# Patient Record
Sex: Male | Born: 1965 | Race: Black or African American | Hispanic: No | Marital: Single | Smoking: Never smoker
Health system: Southern US, Community
[De-identification: ages and names within clinical notes are randomized; demographics above are authoritative.]

## PROBLEM LIST (undated history)

## (undated) DIAGNOSIS — Z9989 Dependence on other enabling machines and devices: Secondary | ICD-10-CM

## (undated) DIAGNOSIS — F419 Anxiety disorder, unspecified: Secondary | ICD-10-CM

## (undated) DIAGNOSIS — R413 Other amnesia: Secondary | ICD-10-CM

## (undated) DIAGNOSIS — F1020 Alcohol dependence, uncomplicated: Secondary | ICD-10-CM

## (undated) DIAGNOSIS — F32A Depression, unspecified: Secondary | ICD-10-CM

## (undated) DIAGNOSIS — F329 Major depressive disorder, single episode, unspecified: Secondary | ICD-10-CM

## (undated) DIAGNOSIS — I10 Essential (primary) hypertension: Secondary | ICD-10-CM

## (undated) DIAGNOSIS — K7010 Alcoholic hepatitis without ascites: Secondary | ICD-10-CM

## (undated) DIAGNOSIS — R002 Palpitations: Secondary | ICD-10-CM

## (undated) DIAGNOSIS — G709 Myoneural disorder, unspecified: Secondary | ICD-10-CM

## (undated) DIAGNOSIS — F319 Bipolar disorder, unspecified: Secondary | ICD-10-CM

## (undated) HISTORY — DX: Palpitations: R00.2

## (undated) HISTORY — DX: Alcohol dependence, uncomplicated: F10.20

## (undated) HISTORY — PX: FINGER SURGERY: SHX640

---
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
## 2015-03-23 ENCOUNTER — Emergency Department (HOSPITAL_COMMUNITY)
Admission: EM | Admit: 2015-03-23 | Discharge: 2015-03-23 | Disposition: A | Discharge status: Home / Self Care | Payer: Self-pay | Attending: Emergency Medicine | Admitting: Emergency Medicine

## 2015-03-23 ENCOUNTER — Emergency Department (HOSPITAL_COMMUNITY): Payer: Self-pay

## 2015-03-23 ENCOUNTER — Encounter (HOSPITAL_COMMUNITY): Payer: Self-pay | Admitting: *Deleted

## 2015-03-23 DIAGNOSIS — S0181XA Laceration without foreign body of other part of head, initial encounter: Secondary | ICD-10-CM | POA: Insufficient documentation

## 2015-03-23 DIAGNOSIS — S8992XA Unspecified injury of left lower leg, initial encounter: Secondary | ICD-10-CM | POA: Insufficient documentation

## 2015-03-23 DIAGNOSIS — IMO0002 Reserved for concepts with insufficient information to code with codable children: Secondary | ICD-10-CM

## 2015-03-23 DIAGNOSIS — Y998 Other external cause status: Secondary | ICD-10-CM | POA: Insufficient documentation

## 2015-03-23 DIAGNOSIS — S4992XA Unspecified injury of left shoulder and upper arm, initial encounter: Secondary | ICD-10-CM | POA: Insufficient documentation

## 2015-03-23 DIAGNOSIS — S6991XA Unspecified injury of right wrist, hand and finger(s), initial encounter: Secondary | ICD-10-CM | POA: Insufficient documentation

## 2015-03-23 DIAGNOSIS — Y92410 Unspecified street and highway as the place of occurrence of the external cause: Secondary | ICD-10-CM | POA: Insufficient documentation

## 2015-03-23 DIAGNOSIS — Y9389 Activity, other specified: Secondary | ICD-10-CM | POA: Insufficient documentation

## 2015-03-23 LAB — BASIC METABOLIC PANEL
ANION GAP: 15 (ref 5–15)
BUN: 9 mg/dL (ref 6–20)
CALCIUM: 9.1 mg/dL (ref 8.9–10.3)
CO2: 18 mmol/L — AB (ref 22–32)
Chloride: 106 mmol/L (ref 101–111)
Creatinine, Ser: 0.78 mg/dL (ref 0.61–1.24)
GFR calc Af Amer: >60 mL/min (ref >60)
GFR calc non Af Amer: >60 mL/min (ref >60)
Glucose, Bld: 99 mg/dL (ref 65–99)
Potassium: 4.2 mmol/L (ref 3.5–5.1)
Sodium: 139 mmol/L (ref 135–145)

## 2015-03-23 LAB — CBC
HEMATOCRIT: 39.5 % (ref 39.0–52.0)
Hemoglobin: 13.6 g/dL (ref 13.0–17.0)
MCH: 27.8 pg (ref 26.0–34.0)
MCHC: 34.4 g/dL (ref 30.0–36.0)
MCV: 80.8 fL (ref 78.0–100.0)
Platelets: 195 10*3/uL (ref 150–400)
RBC: 4.89 MIL/uL (ref 4.22–5.81)
RDW: 13.7 % (ref 11.5–15.5)
WBC: 7.8 10*3/uL (ref 4.0–10.5)

## 2015-03-23 MED ORDER — SODIUM CHLORIDE 0.9 % IV BOLUS (SEPSIS)
1000.0000 mL | Freq: Once | INTRAVENOUS | Status: AC
  Administered 2015-03-23: 1000 mL via INTRAVENOUS

## 2015-03-23 MED ORDER — LIDOCAINE-EPINEPHRINE 1 %-1:100000 IJ SOLN
20.0000 mL | Freq: Once | INTRAMUSCULAR | Status: DC
  Filled 2015-03-23: qty 1

## 2015-03-23 MED ORDER — LORAZEPAM 2 MG/ML IJ SOLN
1.0000 mg | Freq: Once | INTRAMUSCULAR | Status: AC
  Administered 2015-03-23: 1 mg via INTRAVENOUS
  Filled 2015-03-23: qty 1

## 2015-03-23 MED ORDER — TETANUS-DIPHTHERIA TOXOIDS TD 5-2 LFU IM INJ
0.5000 mL | INJECTION | Freq: Once | INTRAMUSCULAR | Status: AC
  Administered 2015-03-23: 0.5 mL via INTRAMUSCULAR
  Filled 2015-03-23: qty 0.5

## 2015-03-23 MED ORDER — AMOXICILLIN 250 MG PO CAPS: 500.0000 mg | ORAL_CAPSULE | Freq: Two times a day (BID) | ORAL | Status: AC

## 2015-03-23 NOTE — ED Notes (Addendum)
Pt returned to room.  Xray tech informed me that the patient refused to have hand xrayed.  States he said he did not need it.

## 2015-03-23 NOTE — ED Notes (Signed)
Suture cart bedside. 
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
## 2015-03-23 NOTE — ED Notes (Signed)
Patient is alert.  Complains of severe headache and increasing swelling in his right hand.  Patient also complains of knee pain.  Patient has oral injuries as well.
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
## 2015-04-26 ENCOUNTER — Emergency Department (HOSPITAL_COMMUNITY): Payer: Self-pay

## 2015-04-26 ENCOUNTER — Encounter (HOSPITAL_COMMUNITY): Payer: Self-pay | Admitting: *Deleted

## 2015-04-26 ENCOUNTER — Emergency Department (HOSPITAL_COMMUNITY)
Admission: EM | Admit: 2015-04-26 | Discharge: 2015-04-26 | Disposition: A | Discharge status: Home / Self Care | Payer: Self-pay | Attending: Emergency Medicine | Admitting: Emergency Medicine

## 2015-04-26 DIAGNOSIS — R002 Palpitations: Secondary | ICD-10-CM | POA: Insufficient documentation

## 2015-04-26 DIAGNOSIS — R55 Syncope and collapse: Secondary | ICD-10-CM | POA: Insufficient documentation

## 2015-04-26 DIAGNOSIS — Z880 Allergy status to penicillin: Secondary | ICD-10-CM | POA: Insufficient documentation

## 2015-04-26 LAB — CBC
HCT: 40.5 % (ref 39.0–52.0)
Hemoglobin: 14 g/dL (ref 13.0–17.0)
MCH: 28.3 pg (ref 26.0–34.0)
MCHC: 34.6 g/dL (ref 30.0–36.0)
MCV: 82 fL (ref 78.0–100.0)
PLATELETS: 237 10*3/uL (ref 150–400)
RBC: 4.94 MIL/uL (ref 4.22–5.81)
RDW: 13.3 % (ref 11.5–15.5)
WBC: 4.4 10*3/uL (ref 4.0–10.5)

## 2015-04-26 LAB — BASIC METABOLIC PANEL
Anion gap: 12 (ref 5–15)
BUN: 7 mg/dL (ref 6–20)
CALCIUM: 8.8 mg/dL — AB (ref 8.9–10.3)
CO2: 22 mmol/L (ref 22–32)
CREATININE: 0.79 mg/dL (ref 0.61–1.24)
Chloride: 105 mmol/L (ref 101–111)
GFR calc Af Amer: >60 mL/min (ref >60)
GFR calc non Af Amer: >60 mL/min (ref >60)
GLUCOSE: 111 mg/dL — AB (ref 65–99)
Potassium: 3.7 mmol/L (ref 3.5–5.1)
Sodium: 139 mmol/L (ref 135–145)

## 2015-04-26 LAB — I-STAT TROPONIN, ED: Troponin i, poc: 0 ng/mL (ref 0.00–0.08)

## 2015-04-26 LAB — URINE MICROSCOPIC-ADD ON

## 2015-04-26 LAB — URINALYSIS, ROUTINE W REFLEX MICROSCOPIC
Bilirubin Urine: NEGATIVE
GLUCOSE, UA: NEGATIVE mg/dL
KETONES UR: NEGATIVE mg/dL
Leukocytes, UA: NEGATIVE
Nitrite: NEGATIVE
Protein, ur: 100 mg/dL — AB
Specific Gravity, Urine: 1.01 (ref 1.005–1.030)
Urobilinogen, UA: 1 mg/dL (ref 0.0–1.0)
pH: 6 (ref 5.0–8.0)

## 2015-04-26 LAB — D-DIMER, QUANTITATIVE (NOT AT ARMC): D DIMER QUANT: 0.62 ug{FEU}/mL — AB (ref 0.00–0.48)

## 2015-04-26 MED ORDER — IOHEXOL 350 MG/ML SOLN
100.0000 mL | Freq: Once | INTRAVENOUS | Status: AC | PRN
  Administered 2015-04-26: 100 mL via INTRAVENOUS

## 2015-04-26 MED ORDER — SODIUM CHLORIDE 0.9 % IV BOLUS (SEPSIS)
1000.0000 mL | Freq: Once | INTRAVENOUS | Status: AC
  Administered 2015-04-26: 1000 mL via INTRAVENOUS

## 2015-04-26 NOTE — ED Notes (Signed)
Pt to CT

## 2015-04-26 NOTE — Discharge Instructions (Signed)
Return to the ED with any concerns including fainting, difficulty breathing, chest pain, decreased level of alertness/lethargy, or any other alarming symptoms  The CT scan of your chest showed:   Possible thyroid enlargement. Suggest thyroid ultrasound  You should arrange for an appointment with a primary care doctor- call the number listed and discuss the thyroid findings with them.  You will need an ultrasound scheduled as an outpatient.    You should also avoid energy drinks and caffeinated beverages as these may be making your symptoms worse.  Also drink more water
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
## 2015-04-26 NOTE — ED Notes (Signed)
Pt c/o chest pain and blurred vision x 4 days worsening over the past 2 days. States that yesterday he almost passed out. States that he has also noticed blood in his underwear from his penis in the morning when he wakes up. Pt denies painful urination.
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
## 2015-04-26 NOTE — ED Notes (Signed)
Pt drinking water without any issue

## 2015-05-02 ENCOUNTER — Encounter: Payer: Self-pay | Admitting: *Deleted

## 2015-05-03 ENCOUNTER — Encounter: Payer: Self-pay | Admitting: Cardiology

## 2018-07-22 ENCOUNTER — Encounter: Payer: Self-pay | Admitting: Pediatric Intensive Care

## 2018-08-05 ENCOUNTER — Emergency Department (HOSPITAL_COMMUNITY): Payer: Self-pay

## 2018-08-05 ENCOUNTER — Emergency Department (HOSPITAL_COMMUNITY)
Admission: EM | Admit: 2018-08-05 | Discharge: 2018-08-05 | Disposition: A | Discharge status: Home / Self Care | Payer: Self-pay | Attending: Emergency Medicine | Admitting: Emergency Medicine

## 2018-08-05 DIAGNOSIS — R29898 Other symptoms and signs involving the musculoskeletal system: Secondary | ICD-10-CM

## 2018-08-05 DIAGNOSIS — Z79899 Other long term (current) drug therapy: Secondary | ICD-10-CM | POA: Insufficient documentation

## 2018-08-05 DIAGNOSIS — R531 Weakness: Secondary | ICD-10-CM | POA: Insufficient documentation

## 2018-08-05 LAB — BASIC METABOLIC PANEL
Anion gap: 9 (ref 5–15)
BUN: 8 mg/dL (ref 6–20)
CO2: 22 mmol/L (ref 22–32)
CREATININE: 0.74 mg/dL (ref 0.61–1.24)
Calcium: 9.1 mg/dL (ref 8.9–10.3)
Chloride: 109 mmol/L (ref 98–111)
GFR calc Af Amer: >60 mL/min (ref >60)
Glucose, Bld: 103 mg/dL — ABNORMAL HIGH (ref 70–99)
Potassium: 4.2 mmol/L (ref 3.5–5.1)
SODIUM: 140 mmol/L (ref 135–145)

## 2018-08-05 LAB — CBC WITH DIFFERENTIAL/PLATELET
ABS IMMATURE GRANULOCYTES: 0.02 10*3/uL (ref 0.00–0.07)
Basophils Absolute: 0.1 10*3/uL (ref 0.0–0.1)
Basophils Relative: 1 %
Eosinophils Absolute: 0.1 10*3/uL (ref 0.0–0.5)
Eosinophils Relative: 1 %
HCT: 43.5 % (ref 39.0–52.0)
HEMOGLOBIN: 13.6 g/dL (ref 13.0–17.0)
Immature Granulocytes: 0 %
LYMPHS ABS: 2.5 10*3/uL (ref 0.7–4.0)
Lymphocytes Relative: 51 %
MCH: 26.5 pg (ref 26.0–34.0)
MCHC: 31.3 g/dL (ref 30.0–36.0)
MCV: 84.6 fL (ref 80.0–100.0)
MONO ABS: 0.3 10*3/uL (ref 0.1–1.0)
MONOS PCT: 6 %
NEUTROS ABS: 2.1 10*3/uL (ref 1.7–7.7)
Neutrophils Relative %: 41 %
PLATELETS: 270 10*3/uL (ref 150–400)
RBC: 5.14 MIL/uL (ref 4.22–5.81)
RDW: 12.3 % (ref 11.5–15.5)
WBC: 5 10*3/uL (ref 4.0–10.5)
nRBC: 0 % (ref 0.0–0.2)

## 2018-08-05 LAB — ETHANOL: Alcohol, Ethyl (B): 237 mg/dL — ABNORMAL HIGH (ref <10)

## 2018-08-05 LAB — I-STAT CHEM 8, ED
BUN: 9 mg/dL (ref 6–20)
CALCIUM ION: 1.09 mmol/L — AB (ref 1.15–1.40)
Chloride: 109 mmol/L (ref 98–111)
Creatinine, Ser: 1.1 mg/dL (ref 0.61–1.24)
Glucose, Bld: 91 mg/dL (ref 70–99)
HCT: 43 % (ref 39.0–52.0)
Hemoglobin: 14.6 g/dL (ref 13.0–17.0)
Potassium: 4.2 mmol/L (ref 3.5–5.1)
SODIUM: 143 mmol/L (ref 135–145)
TCO2: 26 mmol/L (ref 22–32)

## 2018-08-05 NOTE — Discharge Instructions (Signed)
Follow up with your family doc and neurology.  Return to the ED for worsening symptoms

## 2018-08-05 NOTE — ED Notes (Signed)
Patient verbalizes understanding of discharge instructions. Opportunity for questioning and answers were provided. Armband removed by staff, pt discharged from ED ambulatory to home.  
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
## 2018-08-11 ENCOUNTER — Encounter (HOSPITAL_COMMUNITY): Payer: Self-pay | Admitting: Emergency Medicine

## 2018-08-11 ENCOUNTER — Emergency Department (HOSPITAL_COMMUNITY): Payer: Self-pay

## 2018-08-11 ENCOUNTER — Other Ambulatory Visit: Payer: Self-pay

## 2018-08-11 ENCOUNTER — Emergency Department (HOSPITAL_COMMUNITY)
Admission: EM | Admit: 2018-08-11 | Discharge: 2018-08-12 | Disposition: A | Discharge status: Home / Self Care | Payer: Self-pay | Attending: Emergency Medicine | Admitting: Emergency Medicine

## 2018-08-11 DIAGNOSIS — F10120 Alcohol abuse with intoxication, uncomplicated: Secondary | ICD-10-CM | POA: Insufficient documentation

## 2018-08-11 DIAGNOSIS — R531 Weakness: Secondary | ICD-10-CM | POA: Insufficient documentation

## 2018-08-11 DIAGNOSIS — R4182 Altered mental status, unspecified: Secondary | ICD-10-CM | POA: Insufficient documentation

## 2018-08-11 DIAGNOSIS — Y908 Blood alcohol level of 240 mg/100 ml or more: Secondary | ICD-10-CM | POA: Insufficient documentation

## 2018-08-11 LAB — URINALYSIS, ROUTINE W REFLEX MICROSCOPIC
Bilirubin Urine: NEGATIVE
Glucose, UA: NEGATIVE mg/dL
HGB URINE DIPSTICK: NEGATIVE
Ketones, ur: NEGATIVE mg/dL
Leukocytes, UA: NEGATIVE
Nitrite: NEGATIVE
PH: 5 (ref 5.0–8.0)
Protein, ur: NEGATIVE mg/dL
SPECIFIC GRAVITY, URINE: 1.003 — AB (ref 1.005–1.030)

## 2018-08-11 LAB — CBC
HEMATOCRIT: 42.9 % (ref 39.0–52.0)
HEMOGLOBIN: 13.7 g/dL (ref 13.0–17.0)
MCH: 26.8 pg (ref 26.0–34.0)
MCHC: 31.9 g/dL (ref 30.0–36.0)
MCV: 83.8 fL (ref 80.0–100.0)
Platelets: 283 10*3/uL (ref 150–400)
RBC: 5.12 MIL/uL (ref 4.22–5.81)
RDW: 12.3 % (ref 11.5–15.5)
WBC: 5.5 10*3/uL (ref 4.0–10.5)
nRBC: 0 % (ref 0.0–0.2)

## 2018-08-11 LAB — COMPREHENSIVE METABOLIC PANEL
ALBUMIN: 4.5 g/dL (ref 3.5–5.0)
ALT: 19 U/L (ref 0–44)
AST: 46 U/L — AB (ref 15–41)
Alkaline Phosphatase: 47 U/L (ref 38–126)
Anion gap: 15 (ref 5–15)
BUN: 7 mg/dL (ref 6–20)
CHLORIDE: 103 mmol/L (ref 98–111)
CO2: 22 mmol/L (ref 22–32)
Calcium: 9 mg/dL (ref 8.9–10.3)
Creatinine, Ser: 0.66 mg/dL (ref 0.61–1.24)
GFR calc Af Amer: >60 mL/min (ref >60)
Glucose, Bld: 95 mg/dL (ref 70–99)
POTASSIUM: 4.1 mmol/L (ref 3.5–5.1)
SODIUM: 140 mmol/L (ref 135–145)
Total Bilirubin: 0.8 mg/dL (ref 0.3–1.2)
Total Protein: 8.7 g/dL — ABNORMAL HIGH (ref 6.5–8.1)

## 2018-08-11 LAB — RAPID URINE DRUG SCREEN, HOSP PERFORMED
AMPHETAMINES: NOT DETECTED
BENZODIAZEPINES: POSITIVE — AB
Barbiturates: NOT DETECTED
Cocaine: NOT DETECTED
OPIATES: NOT DETECTED
Tetrahydrocannabinol: NOT DETECTED

## 2018-08-11 LAB — SALICYLATE LEVEL: Salicylate Lvl: <7 mg/dL (ref 2.8–30.0)

## 2018-08-11 LAB — ACETAMINOPHEN LEVEL: Acetaminophen (Tylenol), Serum: <10 ug/mL — ABNORMAL LOW (ref 10–30)

## 2018-08-11 LAB — CBG MONITORING, ED: Glucose-Capillary: 93 mg/dL (ref 70–99)

## 2018-08-11 LAB — ETHANOL: ALCOHOL ETHYL (B): 351 mg/dL — AB (ref <10)

## 2018-08-11 MED ORDER — OLANZAPINE 5 MG PO TBDP
10.0000 mg | ORAL_TABLET | Freq: Every day | ORAL | Status: DC
  Filled 2018-08-11: qty 2
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
## 2018-08-11 NOTE — ED Notes (Signed)
Off the floor to MRI with Koleen NimrodAdrian RN accompany

## 2018-08-11 NOTE — ED Notes (Signed)
XR at bedside

## 2018-08-11 NOTE — ED Provider Notes (Signed)
[facility]   CSN: [id number] Arrival date & time: 08/11/18  1759     History   Chief Complaint Chief Complaint  Patient presents with  . Altered Mental Status    HPI Doralee AlbinoKasim Schlereth is a 52 y.o. male.  HPI   Patient is a 52 year old male with a history of EtOH abuse, who presents the emergency department today for evaluation of altered mental status that was noted by 1 of his remains at 5:00PM.  Patient was last known normal was 1 hour prior to this.  According to EMS patient was found next to a bottle of alcohol and smelled of EtOH on their arrival.  In route he became combative and was given 5 of Versed IM.  Patient disoriented x4.  Level 5 caveat given patient's altered mental status. patient answering questions inappropriately.  Moving all extremities.  Past Medical History:  Diagnosis Date  . Alcoholism (HCC)   . Palpitations     There are no active problems to display for this patient.   History reviewed. No pertinent surgical history.      Home Medications    Prior to Admission medications   Medication Sig Start Date End Date Taking? Authorizing Provider  ibuprofen (ADVIL,MOTRIN) 200 MG tablet Take 400 mg by mouth every 6 (six) hours as needed for headache, mild pain or moderate pain.    [provider]    Family History History reviewed. No pertinent family history.  Social History Social History   Tobacco Use  . Smoking status: Never Smoker  . Smokeless tobacco: Never Used  Substance Use Topics  . Alcohol use: Yes    Alcohol/week: 14.0 standard drinks    Types: 14 Standard drinks or equivalent per week    Comment: daily, beer and/or wine  . Drug use: No     Allergies   Amoxicillin   Review of Systems Review of Systems  Unable to perform ROS: Mental status change     Physical Exam Updated Vital Signs BP (!) 146/62   Pulse 78   Temp 98.2 F (36.8 C) (Oral)   Resp 13    SpO2 99%   Physical Exam  Constitutional: He appears well-developed and well-nourished.  HENT:  Head: Normocephalic and atraumatic.  Mouth/Throat: Oropharynx is clear and moist.  Eyes:  Pupils are pinpoint, but equal round and reactive bilaterally.  Neck: Neck supple.  No midline cervical spine tenderness.  Cardiovascular: Normal rate, regular rhythm, normal heart sounds and intact distal pulses.  Pulmonary/Chest: Effort normal and breath sounds normal. No respiratory distress. He has no wheezes.  Abdominal: Soft. Bowel sounds are normal. There is no tenderness.  Musculoskeletal:  Moving all extremities. No TTP throughout BUE and BLE.   Neurological: He is alert.  Patient in restraints on arrival.  Will not answer questions but is asking if he can leave.  Moving BUE in restraints. Unable to follow commands.   Nursing note and vitals reviewed.   ED Treatments / Results  Labs (all labs ordered are listed, but only abnormal results are displayed) Labs Reviewed  COMPREHENSIVE METABOLIC PANEL - Abnormal; Notable for the following components:      Result Value   Total Protein 8.7 (*)    AST 46 (*)    All other components within normal limits  ETHANOL - Abnormal; Notable for the following components:   Alcohol, Ethyl (B) 351 (*)    All other components within normal limits  RAPID URINE  DRUG SCREEN, HOSP PERFORMED - Abnormal; Notable for the following components:   Benzodiazepines POSITIVE (*)    All other components within normal limits  URINALYSIS, ROUTINE W REFLEX MICROSCOPIC - Abnormal; Notable for the following components:   Color, Urine COLORLESS (*)    Specific Gravity, Urine 1.003 (*)    All other components within normal limits  ACETAMINOPHEN LEVEL - Abnormal; Notable for the following components:   Acetaminophen (Tylenol), Serum <10 (*)    All other components within normal limits  CBC  SALICYLATE LEVEL  CBG MONITORING, ED    EKG EKG  Interpretation  Date/Time:  Monday August 11 2018 18:03:01 EST Ventricular Rate:  93 PR Interval:    QRS Duration: 89 QT Interval:  364 QTC Calculation: 453 R Axis:   57 Text Interpretation:  Sinus rhythm Minimal ST elevation, anterior leads Baseline wander in lead(s) V2 Rate slower Otherwise no significant change Confirmed by Melene Plan [phone]) on 08/11/2018 6:06:33 PM   Radiology Ct Head Wo Contrast  Result Date: 08/11/2018 CLINICAL DATA:  Patient with altered mental status. EXAM: CT HEAD WITHOUT CONTRAST CT CERVICAL SPINE WITHOUT CONTRAST TECHNIQUE: Multidetector CT imaging of the head and cervical spine was performed following the standard protocol without intravenous contrast. Multiplanar CT image reconstructions of the cervical spine were also generated. COMPARISON:  Head and cervical spine CT 08/05/2018 FINDINGS: CT HEAD FINDINGS Brain: Ventricles and sulci are appropriate for patient's age. No evidence for acute cortically based infarct, intracranial hemorrhage, mass lesion or mass-effect. Dense bilateral basal ganglia calcifications. Vascular: Unremarkable Skull: Intact Sinuses/Orbits: Old left lamina papyracea. Mucosal thickening involving the left maxillary sinus. Mastoid air cells unremarkable. Orbits are unremarkable. Other: None. CT CERVICAL SPINE FINDINGS Alignment: Normal anatomic alignment. Skull base and vertebrae: Intact Soft tissues and spinal canal: Unremarkable Disc levels: Visualization through C7 on lateral view. Multilevel degenerative disc disease most pronounced C3-4, C4-5 and C5-6. Upper chest: Not visualized. Other: None IMPRESSION: No acute intracranial process. No acute cervical spine fracture. Electronically Signed   By: Annia Belt M.D.   On: 08/11/2018 20:34   Ct Cervical Spine Wo Contrast  Result Date: 08/11/2018 CLINICAL DATA:  Patient with altered mental status. EXAM: CT HEAD WITHOUT CONTRAST CT CERVICAL SPINE WITHOUT CONTRAST TECHNIQUE: Multidetector CT  imaging of the head and cervical spine was performed following the standard protocol without intravenous contrast. Multiplanar CT image reconstructions of the cervical spine were also generated. COMPARISON:  Head and cervical spine CT 08/05/2018 FINDINGS: CT HEAD FINDINGS Brain: Ventricles and sulci are appropriate for patient's age. No evidence for acute cortically based infarct, intracranial hemorrhage, mass lesion or mass-effect. Dense bilateral basal ganglia calcifications. Vascular: Unremarkable Skull: Intact Sinuses/Orbits: Old left lamina papyracea. Mucosal thickening involving the left maxillary sinus. Mastoid air cells unremarkable. Orbits are unremarkable. Other: None. CT CERVICAL SPINE FINDINGS Alignment: Normal anatomic alignment. Skull base and vertebrae: Intact Soft tissues and spinal canal: Unremarkable Disc levels: Visualization through C7 on lateral view. Multilevel degenerative disc disease most pronounced C3-4, C4-5 and C5-6. Upper chest: Not visualized. Other: None IMPRESSION: No acute intracranial process. No acute cervical spine fracture. Electronically Signed   By: Annia Belt M.D.   On: 08/11/2018 20:34   Mr Brain Wo Contrast  Result Date: 08/11/2018 CLINICAL DATA:  Altered mental status, found down. Combative. History of alcoholism. EXAM: MRI HEAD WITHOUT CONTRAST TECHNIQUE: Multiplanar, multiecho pulse sequences of the brain and surrounding structures were obtained without intravenous contrast. COMPARISON:  CT HEAD August 11, 2018 FINDINGS: INTRACRANIAL CONTENTS:  No reduced diffusion to suggest acute ischemia. No susceptibility artifact to suggest hemorrhage. Dentate nuclei and bilateral basal ganglia mineralization. The ventricles and sulci are normal for patient's age, cavum velum interpositum. No suspicious parenchymal signal, masses, mass effect. No abnormal extra-axial fluid collections. No extra-axial masses. VASCULAR: Normal major intracranial vascular flow voids present at  skull base. SKULL AND UPPER CERVICAL SPINE: No abnormal sellar expansion. No suspicious calvarial bone marrow signal. Craniocervical junction maintained. SINUSES/ORBITS: Mild paranasal sinus mucosal thickening. No air-fluid levels. Old bilateral medial orbital blowout fractures. Included ocular globes and orbital contents are non-suspicious. OTHER: None. IMPRESSION: 1. Negative noncontrast MRI head. Electronically Signed   By: Awilda Metro M.D.   On: 08/11/2018 23:28   Dg Chest Portable 1 View  Result Date: 08/11/2018 CLINICAL DATA:  Left leg numbness.  Altered mental status. EXAM: PORTABLE CHEST 1 VIEW COMPARISON:  04/26/2015 FINDINGS: The heart size and mediastinal contours are within normal limits. Unchanged scar like density within the left base. The visualized skeletal structures are unremarkable. IMPRESSION: No active disease. Electronically Signed   By: Signa Kell M.D.   On: 08/11/2018 19:44    Procedures Procedures (including critical care time)  Medications Ordered in ED Medications - No data to display   Initial Impression / Assessment and Plan / ED Course  I have reviewed the triage vital signs and the nursing notes.  Pertinent labs & imaging results that were available during my care of the patient were reviewed by me and considered in my medical decision making (see chart for details).     Final Clinical Impressions(s) / ED Diagnoses   Final diagnoses:  Altered mental status, unspecified altered mental status type  Left-sided weakness   52 year old male with a history of EtOH abuse found down by his roommates next to a bottle of alcohol prior to arrival.  He was combative with EMS and route and received 5 of Versed IM.  On initial evaluation patient is altered and not answering questions appropriately or following commands.  After a period of several hours of observation in the ED he became more alert and was able to answer questions.  Was able to follow some  commands.  After becoming more alert stated that he has been having left upper extremity and left lower extremity weakness for "a few weeks ".    Exam is somewhat inconsistent as he will intermittently participate in strength testing.  He does appear to have some decrease strength of the left upper extremity.  He will not move the left lower extremity at all.  Will not withdraw left lower extremity to pain.  Reports abnormal sensation to the left upper extremity and left side of the face.  He has no obvious facial droop on exam though it is difficult to appreciate this given his lack of participation in the exam.  Labs notable for normal CBC, CMP with slightly elevated AST at 46. Normal ALT. Normal kidney function and electrolytes. ETOH elevated at 350. UDS positive for benzos. Acetaminophen and salicylate levels WNL. UA negative.   CXR negative.  EKG with NSR, no significant change from prior other than rate.   10:15 PM CONSULT with Dr. Laurence Slate who recommended obtaining an MRI of the patient. He states he is available for consult following MRI results.   11:35 PM Pt was ambulated with nursing staff. He had a mild limp with ambulation but was able to ambulate without significant assistance or ataxia. He is now moving the LLE.  11:45 PM CONSULT with Dr. Laurence Slate who will evaluate the patient and give further recommendations.    Pt care signed out to Fayrene Helper, PA-C at shift change with plan to f/u on neurology recommendations and disposition accordingly.   ED Discharge Orders         Ordered    Ambulatory referral to Neurology    Comments:  An appointment is requested in approximately: 1 week   08/11/18 2352           Karrie Meres, PA-C 08/11/18 2355    Melene Plan, DO 08/11/18 2358

## 2018-08-11 NOTE — Discharge Instructions (Addendum)
You have weakness to your left leg.  You may benefit from an MRI of your lower back.  This was not done today.  If your condition worsen please return.  You were given an outpatient referral to neurology. The office will contact you to make an appointment for follow up.

## 2018-08-11 NOTE — ED Notes (Signed)
ED Provider at bedside. Non violent restraints to be removed. Pt less aggressive but still confused.

## 2018-08-12 DIAGNOSIS — R4182 Altered mental status, unspecified: Secondary | ICD-10-CM

## 2018-08-12 DIAGNOSIS — R531 Weakness: Secondary | ICD-10-CM
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
## 2018-08-12 NOTE — ED Notes (Signed)
PT states understanding of care given, follow up care. PT ambulated from ED to car with a steady gait.  
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
## 2018-08-19 NOTE — Congregational Nurse Program (Signed)
New client encounter. Client states history of numbness in left arm with neck pain. CN advised follow up at [facility] for evaluation with NP and gave bus passes.

## 2019-01-21 ENCOUNTER — Encounter (HOSPITAL_COMMUNITY): Payer: Self-pay | Admitting: Emergency Medicine

## 2019-01-21 ENCOUNTER — Emergency Department (HOSPITAL_COMMUNITY)
Admission: EM | Admit: 2019-01-21 | Discharge: 2019-01-21 | Disposition: A | Discharge status: Home / Self Care | Payer: Self-pay | Attending: Emergency Medicine | Admitting: Emergency Medicine

## 2019-01-21 ENCOUNTER — Emergency Department (HOSPITAL_COMMUNITY): Payer: Self-pay

## 2019-01-21 DIAGNOSIS — R000 Tachycardia, unspecified: Secondary | ICD-10-CM | POA: Insufficient documentation

## 2019-01-21 DIAGNOSIS — F10129 Alcohol abuse with intoxication, unspecified: Secondary | ICD-10-CM | POA: Insufficient documentation

## 2019-01-21 DIAGNOSIS — R55 Syncope and collapse: Secondary | ICD-10-CM | POA: Insufficient documentation

## 2019-01-21 DIAGNOSIS — R61 Generalized hyperhidrosis: Secondary | ICD-10-CM | POA: Insufficient documentation

## 2019-01-21 DIAGNOSIS — R112 Nausea with vomiting, unspecified: Secondary | ICD-10-CM | POA: Insufficient documentation

## 2019-01-21 DIAGNOSIS — Y906 Blood alcohol level of 120-199 mg/100 ml: Secondary | ICD-10-CM | POA: Insufficient documentation

## 2019-01-21 DIAGNOSIS — F1010 Alcohol abuse, uncomplicated: Secondary | ICD-10-CM

## 2019-01-21 DIAGNOSIS — E860 Dehydration: Secondary | ICD-10-CM | POA: Insufficient documentation

## 2019-01-21 LAB — CBC WITH DIFFERENTIAL/PLATELET
Abs Immature Granulocytes: 0.01 10*3/uL (ref 0.00–0.07)
Basophils Absolute: 0 10*3/uL (ref 0.0–0.1)
Basophils Relative: 1 %
Eosinophils Absolute: 0.1 10*3/uL (ref 0.0–0.5)
Eosinophils Relative: 2 %
HCT: 41.4 % (ref 39.0–52.0)
Hemoglobin: 14.7 g/dL (ref 13.0–17.0)
Immature Granulocytes: 0 %
Lymphocytes Relative: 58 %
Lymphs Abs: 1.8 10*3/uL (ref 0.7–4.0)
MCH: 30 pg (ref 26.0–34.0)
MCHC: 35.5 g/dL (ref 30.0–36.0)
MCV: 84.5 fL (ref 80.0–100.0)
Monocytes Absolute: 0.2 10*3/uL (ref 0.1–1.0)
Monocytes Relative: 7 %
Neutro Abs: 1 10*3/uL — ABNORMAL LOW (ref 1.7–7.7)
Neutrophils Relative %: 32 %
Platelets: 110 10*3/uL — ABNORMAL LOW (ref 150–400)
RBC: 4.9 MIL/uL (ref 4.22–5.81)
RDW: 12.7 % (ref 11.5–15.5)
WBC: 3.1 10*3/uL — ABNORMAL LOW (ref 4.0–10.5)
nRBC: 0 % (ref 0.0–0.2)

## 2019-01-21 LAB — COMPREHENSIVE METABOLIC PANEL
ALT: 109 U/L — ABNORMAL HIGH (ref 0–44)
AST: 543 U/L — ABNORMAL HIGH (ref 15–41)
Albumin: 3.1 g/dL — ABNORMAL LOW (ref 3.5–5.0)
Alkaline Phosphatase: 80 U/L (ref 38–126)
Anion gap: 13 (ref 5–15)
BUN: 12 mg/dL (ref 6–20)
CO2: 17 mmol/L — ABNORMAL LOW (ref 22–32)
Calcium: 8.2 mg/dL — ABNORMAL LOW (ref 8.9–10.3)
Chloride: 103 mmol/L (ref 98–111)
Creatinine, Ser: 1.14 mg/dL (ref 0.61–1.24)
GFR calc Af Amer: >60 mL/min (ref >60)
GFR calc non Af Amer: >60 mL/min (ref >60)
Glucose, Bld: 153 mg/dL — ABNORMAL HIGH (ref 70–99)
Potassium: 3.9 mmol/L (ref 3.5–5.1)
Sodium: 133 mmol/L — ABNORMAL LOW (ref 135–145)
Total Bilirubin: 2 mg/dL — ABNORMAL HIGH (ref 0.3–1.2)
Total Protein: 7.3 g/dL (ref 6.5–8.1)

## 2019-01-21 LAB — LACTIC ACID, PLASMA: Lactic Acid, Venous: 3.1 mmol/L (ref 0.5–1.9)

## 2019-01-21 LAB — ETHANOL: Alcohol, Ethyl (B): 122 mg/dL — ABNORMAL HIGH (ref <10)

## 2019-01-21 MED ORDER — SODIUM CHLORIDE 0.9 % IV BOLUS
1000.0000 mL | Freq: Once | INTRAVENOUS | Status: AC
  Administered 2019-01-21: 1000 mL via INTRAVENOUS

## 2019-01-21 NOTE — ED Triage Notes (Signed)
Pt here from [facility] with a near syncopal event and hypotension  along with n/v/d

## 2019-01-21 NOTE — ED Provider Notes (Signed)
[facility] EMERGENCY DEPARTMENT Provider Note   CSN: [id number] Arrival date & time: 01/21/19  1526    History   Chief Complaint Chief Complaint  Patient presents with  . Near Syncope    HPI Jack Fernandez is a 53 y.o. male.     Patient is a 53 year old male with a history of alcoholism, palpitations who is presenting today via EMS after having a syncopal event at his caseworker's office.  Patient states that he had gotten himself some lunch and had just finished eating when he was standing and started feeling very hot.  It was then reported that patient had a syncopal event and was lowered to the floor.  He was diaphoretic but had no seizure-like activity.  Episode lasted for a few minutes and he states when he woke up everybody was standing around him.  EMS reported initial blood pressure was 80/40 which improved without intervention.  Patient states earlier today he felt his normal self.  He has been eating and drinking normally and denies any nausea, vomiting or diarrhea prior to the event.  After the event he did have an episode of vomiting but denies any nausea now.  Patient does admit to drinking at least two 40s a day but has not drank anything today.  He takes no medications regularly.  He denies any abdominal pain, chest pain or shortness of breath.  He did not have any of these symptoms prior to his syncope either.  He denies any palpitations.  The history is provided by the patient and the EMS personnel.  Loss of Consciousness  Episode history:  Single Most recent episode:  Today Timing:  Constant Progression:  Resolved Chronicity:  Recurrent Context: standing up   Witnessed: yes   Relieved by:  Lying down Worsened by:  Nothing Ineffective treatments:  None tried Associated symptoms: diaphoresis, dizziness, nausea and vomiting   Associated symptoms: no chest pain, no confusion, no fever, no focal weakness, no headaches, no palpitations, no recent  injury, no rectal bleeding, no seizures, no shortness of breath and no weakness     Past Medical History:  Diagnosis Date  . Alcoholism (HCC)   . Palpitations     There are no active problems to display for this patient.   History reviewed. No pertinent surgical history.      Home Medications    Prior to Admission medications   Not on File    Family History History reviewed. No pertinent family history.  Social History Social History   Tobacco Use  . Smoking status: Never Smoker  . Smokeless tobacco: Never Used  Substance Use Topics  . Alcohol use: Yes    Alcohol/week: 14.0 standard drinks    Types: 14 Standard drinks or equivalent per week    Comment: daily, beer and/or wine  . Drug use: No     Allergies   Amoxicillin   Review of Systems Review of Systems  Constitutional: Positive for diaphoresis. Negative for fever.  Respiratory: Negative for shortness of breath.   Cardiovascular: Positive for syncope and near-syncope. Negative for chest pain and palpitations.  Gastrointestinal: Positive for nausea and vomiting.  Neurological: Positive for dizziness. Negative for focal weakness, seizures, weakness and headaches.  Psychiatric/Behavioral: Negative for confusion.  All other systems reviewed and are negative.    Physical Exam Updated Vital Signs BP (!) 119/92   Pulse (!) 113   Temp 98.7 F (37.1 C) (Oral)   Resp (!) 21   SpO2 100%  Physical Exam Vitals signs and nursing note reviewed.  Constitutional:      General: He is not in acute distress.    Appearance: He is well-developed.  HENT:     Head: Normocephalic and atraumatic.  Eyes:     Conjunctiva/sclera: Conjunctivae normal.     Pupils: Pupils are equal, round, and reactive to light.  Neck:     Musculoskeletal: Normal range of motion and neck supple.  Cardiovascular:     Rate and Rhythm: Regular rhythm. Tachycardia present.     Heart sounds: No murmur.  Pulmonary:     Effort:  Pulmonary effort is normal. No respiratory distress.     Breath sounds: Normal breath sounds. No wheezing or rales.  Abdominal:     General: There is no distension.     Palpations: Abdomen is soft.     Tenderness: There is no abdominal tenderness. There is no guarding or rebound.  Musculoskeletal: Normal range of motion.        General: No tenderness.  Skin:    General: Skin is warm and dry.     Findings: No erythema or rash.  Neurological:     Mental Status: He is alert and oriented to person, place, and time. Mental status is at baseline.     Sensory: Sensation is intact.     Coordination: Coordination is intact.     Comments: 4 out of 5 strength in the left lower extremity.  5 out of 5 strength in bilateral upper extremities and right lower extremity.  Sensation intact in all 4 extremities.  Patient states the lower extremity weakness is chronic.  Psychiatric:        Behavior: Behavior normal.     Comments: Tearful and guarded      ED Treatments / Results  Labs (all labs ordered are listed, but only abnormal results are displayed) Labs Reviewed  CBC WITH DIFFERENTIAL/PLATELET - Abnormal; Notable for the following components:      Result Value   WBC 3.1 (*)    Platelets 110 (*)    Neutro Abs 1.0 (*)    All other components within normal limits  COMPREHENSIVE METABOLIC PANEL - Abnormal; Notable for the following components:   Sodium 133 (*)    CO2 17 (*)    Glucose, Bld 153 (*)    Calcium 8.2 (*)    Albumin 3.1 (*)    AST 543 (*)    ALT 109 (*)    Total Bilirubin 2.0 (*)    All other components within normal limits  ETHANOL - Abnormal; Notable for the following components:   Alcohol, Ethyl (B) 122 (*)    All other components within normal limits  LACTIC ACID, PLASMA - Abnormal; Notable for the following components:   Lactic Acid, Venous 3.1 (*)    All other components within normal limits    EKG EKG Interpretation  Date/Time:  Wednesday Jan 21 2019 15:30:55 EDT  Ventricular Rate:  109 PR Interval:    QRS Duration: 81 QT Interval:  333 QTC Calculation: 449 R Axis:   74 Text Interpretation:  Sinus tachycardia Ventricular premature complex Aberrant conduction of SV complex(es) No significant change since last tracing Confirmed by Gwyneth Sprout ([id number]) on 01/21/2019 3:49:08 PM   Radiology Dg Chest Port 1 View  Result Date: 01/21/2019 CLINICAL DATA:  near syncopal event and hypotension along with n/v/d EXAM: PORTABLE CHEST - 1 VIEW COMPARISON:  08/11/2018 FINDINGS: Minimal linear scarring or atelectasis at the lung bases as  before. No new infiltrate or overt edema. Heart size and mediastinal contours are within normal limits. No effusion. Visualized bones unremarkable. IMPRESSION: No acute cardiopulmonary disease. Electronically Signed   By: Corlis Leak  Hassell M.D.   On: 01/21/2019 16:16    Procedures Procedures (including critical care time)  Medications Ordered in ED Medications  sodium chloride 0.9 % bolus 1,000 mL (1,000 mLs Intravenous New Bag/Given 01/21/19 1555)     Initial Impression / Assessment and Plan / ED Course  I have reviewed the triage vital signs and the nursing notes.  Pertinent labs & imaging results that were available during my care of the patient were reviewed by me and considered in my medical decision making (see chart for details).       53 year old male presenting today after a syncopal event that was most likely vasovagal.  Patient was initially hypotensive on EMS arrival which resolved spontaneously.  Patient has had normal blood pressure here.  He has been persistently tachycardic but EKG just shows sinus tachycardia.  He denied any chest pain, palpitations or shortness of breath before or after the event.  He did have an episode of vomiting but denies any symptoms now.  She has no known heart history but does have a history of alcoholism.  Concern for possible dehydration and was given IV fluids.  CBC with mild  leukocytosis in thrombocytopenia.  Lactate elevated at 3.1.  Chest x-ray within normal limits.  CMP and EtOH is pending.  Suspicion for dissection, PE, ACS as the cause of his syncope.  Possibility for electrolyte abnormalities.  No signs to suggest alcohol withdrawal.  Alcohol level is elevated at 122 and CMP with evidence of alcoholic hepatitis but normal renal function.  Anion gap of 13.  Patient's heart rate improved after IV fluids.  Suspect the syncope was related to dehydration related to chronic alcoholism.  With all other testing appearing normal feel that patient is safe for discharge home.  Final Clinical Impressions(s) / ED Diagnoses   Final diagnoses:  Syncope and collapse  Dehydration  Alcohol abuse    ED Discharge Orders    None       Gwyneth SproutPlunkett, Avina Eberle, MD 01/21/19 1756

## 2019-01-25 ENCOUNTER — Emergency Department (HOSPITAL_COMMUNITY): Payer: Self-pay

## 2019-01-25 ENCOUNTER — Emergency Department (HOSPITAL_COMMUNITY)
Admission: EM | Admit: 2019-01-25 | Discharge: 2019-01-25 | Disposition: A | Discharge status: Home / Self Care | Payer: Self-pay | Attending: Emergency Medicine | Admitting: Emergency Medicine

## 2019-01-25 ENCOUNTER — Encounter (HOSPITAL_COMMUNITY): Payer: Self-pay | Admitting: *Deleted

## 2019-01-25 ENCOUNTER — Other Ambulatory Visit: Payer: Self-pay

## 2019-01-25 DIAGNOSIS — E860 Dehydration: Secondary | ICD-10-CM | POA: Insufficient documentation

## 2019-01-25 DIAGNOSIS — W010XXA Fall on same level from slipping, tripping and stumbling without subsequent striking against object, initial encounter: Secondary | ICD-10-CM | POA: Insufficient documentation

## 2019-01-25 DIAGNOSIS — I951 Orthostatic hypotension: Secondary | ICD-10-CM | POA: Insufficient documentation

## 2019-01-25 DIAGNOSIS — W19XXXA Unspecified fall, initial encounter: Secondary | ICD-10-CM

## 2019-01-25 DIAGNOSIS — F1011 Alcohol abuse, in remission: Secondary | ICD-10-CM | POA: Insufficient documentation

## 2019-01-25 LAB — COMPREHENSIVE METABOLIC PANEL
ALT: 57 U/L — ABNORMAL HIGH (ref 0–44)
AST: 232 U/L — ABNORMAL HIGH (ref 15–41)
Albumin: 2.4 g/dL — ABNORMAL LOW (ref 3.5–5.0)
Alkaline Phosphatase: 70 U/L (ref 38–126)
Anion gap: 13 (ref 5–15)
BUN: 9 mg/dL (ref 6–20)
CO2: 19 mmol/L — ABNORMAL LOW (ref 22–32)
Calcium: 7.4 mg/dL — ABNORMAL LOW (ref 8.9–10.3)
Chloride: 99 mmol/L (ref 98–111)
Creatinine, Ser: 0.52 mg/dL — ABNORMAL LOW (ref 0.61–1.24)
GFR calc Af Amer: >60 mL/min (ref >60)
GFR calc non Af Amer: >60 mL/min (ref >60)
Glucose, Bld: 98 mg/dL (ref 70–99)
Potassium: 3.9 mmol/L (ref 3.5–5.1)
Sodium: 131 mmol/L — ABNORMAL LOW (ref 135–145)
Total Bilirubin: 1.8 mg/dL — ABNORMAL HIGH (ref 0.3–1.2)
Total Protein: 6.1 g/dL — ABNORMAL LOW (ref 6.5–8.1)

## 2019-01-25 LAB — CBC WITH DIFFERENTIAL/PLATELET
Abs Immature Granulocytes: 0.01 10*3/uL (ref 0.00–0.07)
Basophils Absolute: 0.1 10*3/uL (ref 0.0–0.1)
Basophils Relative: 1 %
Eosinophils Absolute: 0.1 10*3/uL (ref 0.0–0.5)
Eosinophils Relative: 2 %
HCT: 36.2 % — ABNORMAL LOW (ref 39.0–52.0)
Hemoglobin: 12.8 g/dL — ABNORMAL LOW (ref 13.0–17.0)
Immature Granulocytes: 0 %
Lymphocytes Relative: 43 %
Lymphs Abs: 1.8 10*3/uL (ref 0.7–4.0)
MCH: 30 pg (ref 26.0–34.0)
MCHC: 35.4 g/dL (ref 30.0–36.0)
MCV: 84.8 fL (ref 80.0–100.0)
Monocytes Absolute: 0.4 10*3/uL (ref 0.1–1.0)
Monocytes Relative: 10 %
Neutro Abs: 1.8 10*3/uL (ref 1.7–7.7)
Neutrophils Relative %: 44 %
Platelets: 128 10*3/uL — ABNORMAL LOW (ref 150–400)
RBC: 4.27 MIL/uL (ref 4.22–5.81)
RDW: 13.2 % (ref 11.5–15.5)
WBC: 4.1 10*3/uL (ref 4.0–10.5)
nRBC: 0 % (ref 0.0–0.2)

## 2019-01-25 LAB — CBG MONITORING, ED: Glucose-Capillary: 97 mg/dL (ref 70–99)

## 2019-01-25 MED ORDER — SODIUM CHLORIDE 0.9 % IV BOLUS
1000.0000 mL | Freq: Once | INTRAVENOUS | Status: AC
  Administered 2019-01-25: 1000 mL via INTRAVENOUS

## 2019-01-25 MED ORDER — SODIUM CHLORIDE 0.9 % IV SOLN
INTRAVENOUS | Status: DC
  Administered 2019-01-25: 10:00:00 via INTRAVENOUS

## 2019-01-25 NOTE — ED Notes (Signed)
Upon standing, pt's blood pressure dropped significantly (see flowsheet) and became slightly unsteady on his feet.

## 2019-01-25 NOTE — ED Notes (Signed)
Pt returned to room  

## 2019-01-25 NOTE — ED Provider Notes (Signed)
[facility] EMERGENCY DEPARTMENT Provider Note  CSN: [id number] Arrival date & time: 01/25/19 0546  Chief Complaint(s) Hypotension  HPI Jack Fernandez is a 53 y.o. male with a history of alcoholism who presents to the emergency department by EMS after near syncopal episode while standing.  Patient had several episodes like this this evening resulting in fall and head trauma.  When EMS got called out patient had systolics in the 70s.  Patient was provided with 375 cc of saline.  Patient reports that for the last 3 days he has had nausea with nonbloody nonbilious emesis and diarrhea.  No known sick contacts.  No abdominal pain.  No chest pain or shortness of breath.  Reports that he drinks 2 40 ounce beers per day.  Denies any other illicit drug use.  HPI  Past Medical History Past Medical History:  Diagnosis Date  . Alcoholism (HCC)   . Palpitations    There are no active problems to display for this patient.  Home Medication(s) Prior to Admission medications   Not on File                                                                                                                                    Past Surgical History History reviewed. No pertinent surgical history. Family History No family history on file.  Social History Social History   Tobacco Use  . Smoking status: Never Smoker  . Smokeless tobacco: Never Used  Substance Use Topics  . Alcohol use: Yes    Alcohol/week: 14.0 standard drinks    Types: 14 Standard drinks or equivalent per week    Comment: daily, beer and/or wine  . Drug use: No   Allergies Amoxicillin  Review of Systems Review of Systems All other systems are reviewed and are negative for acute change except as noted in the HPI  Physical Exam Vital Signs  I have reviewed the triage vital signs BP (!) 136/98   Pulse (!) 102   Temp 98.3 F (36.8 C)   Resp (!) 24   Ht 5\' 9"  (1.753 m)   Wt 81.6 kg   SpO2 98%   BMI 26.58  kg/m   Physical Exam Vitals signs reviewed.  Constitutional:      General: He is not in acute distress.    Appearance: He is well-developed. He is not diaphoretic.  HENT:     Head: Normocephalic and atraumatic.     Nose: Nose normal.  Eyes:     General: No scleral icterus.       Right eye: No discharge.        Left eye: No discharge.     Conjunctiva/sclera: Conjunctivae normal.     Pupils: Pupils are equal, round, and reactive to light.  Neck:     Musculoskeletal: Normal range of motion and neck supple.  Cardiovascular:     Rate and Rhythm: Normal rate  and regular rhythm.     Heart sounds: No murmur. No friction rub. No gallop.   Pulmonary:     Effort: Pulmonary effort is normal. No respiratory distress.     Breath sounds: Normal breath sounds. No stridor. No rales.  Abdominal:     General: There is no distension.     Palpations: Abdomen is soft.     Tenderness: There is no abdominal tenderness.  Musculoskeletal:        General: No tenderness.  Skin:    General: Skin is warm and dry.     Findings: No erythema or rash.  Neurological:     Mental Status: He is alert and oriented to person, place, and time.     ED Results and Treatments Labs (all labs ordered are listed, but only abnormal results are displayed) Labs Reviewed  CBC WITH DIFFERENTIAL/PLATELET  COMPREHENSIVE METABOLIC PANEL  CBG MONITORING, ED                                                                                                                         EKG  EKG Interpretation  Date/Time:  Sunday Jan 25 2019 06:20:53 EDT Ventricular Rate:  67 PR Interval:    QRS Duration: 84 QT Interval:  437 QTC Calculation: 462 R Axis:   67 Text Interpretation:  Sinus rhythm Atrial premature complexes No significant change since last tracing Confirmed by Drema Pryardama, Gustava Berland [phone] on 01/25/2019 6:50:03 AM Also confirmed by Drema Pryardama, Jayquon Theiler [phone]), editor Barbette Hairassel, Kerry [phone]  on 01/25/2019 7:21:03 AM       Radiology No results found. Pertinent labs & imaging results that were available during my care of the patient were reviewed by me and considered in my medical decision making (see chart for details).  Medications Ordered in ED Medications  sodium chloride 0.9 % bolus 1,000 mL (0 mLs Intravenous Stopped 01/25/19 0633)    And  sodium chloride 0.9 % bolus 1,000 mL (has no administration in time range)    And  0.9 %  sodium chloride infusion (has no administration in time range)                                                                                                                                    Procedures Procedures  (including critical care time)  Medical Decision Making / ED Course I have reviewed the nursing notes for this encounter  and the patient's prior records (if available in EHR or on provided paperwork).    Patient presents for syncopal episode and orthostasis in the setting of alcoholism.  He is already received 375 of IV fluids with improved pressures.  Orthostatics repeated and still dropping.  CT head obtained.  On my evaluation I did not see any evidence of large ICH.  Read pending.  Patient provided with IV fluids. Will need reassessment.  Patient care turned over to Dr Jodelle Gross at 0700. Patient case and results discussed in detail; please see their note for further ED managment.      Final Clinical Impression(s) / ED Diagnoses Final diagnoses:  Orthostasis  Fall, initial encounter      This chart was dictated using voice recognition software.  Despite best efforts to proofread,  errors can occur which can change the documentation meaning.   Nira Conn, MD 01/25/19 [id number]

## 2019-01-25 NOTE — ED Provider Notes (Signed)
Following fluids patient's orthostatic blood pressures have improved.  Patient is alert.  Patient requested food and has been provided food.  Patient stable for discharge home.  Alcohol level not checked but patient with a history of alcohol abuse.  Hypotension probably secondary to dehydration.  Patient received 2 L of fluid and now orthostatics are normal.   Vanetta Mulders, MD 01/25/19 1017

## 2019-01-25 NOTE — ED Triage Notes (Signed)
Pt presents to ED c/o hypotension, dizziness, dehydration, n/v, headache. Per EMS pressure was initially 78 palp, hr -120. Per EMS pt was initially unsteady. Pressure came up to 135/96 with 305 NS. Pt says he is vomiting every morning since wed. Pt was seen here Wednesday for the same thing and was not compliant with trying to stay hydrated.

## 2019-01-25 NOTE — ED Notes (Signed)
This RN called patients landlord per his request, she stated she would pick him up from ER entrance.

## 2019-01-25 NOTE — ED Notes (Signed)
Patient transported to CT 

## 2019-01-25 NOTE — ED Notes (Signed)
Patient needed to use restroom again would not complete orthostatic vitals.  No dizziness with ambulation, no difficulty with ambulation

## 2019-01-25 NOTE — ED Notes (Signed)
Patient verbalizes understanding of discharge instructions. Opportunity for questioning and answers were provided. Armband removed by staff, pt discharged from ED home via POV.  

## 2019-01-25 NOTE — Discharge Instructions (Addendum)
Recommend alcohol avoidance.  Return for any new or worse symptoms.

## 2019-02-04 ENCOUNTER — Other Ambulatory Visit: Payer: Self-pay | Admitting: Critical Care Medicine

## 2019-02-04 ENCOUNTER — Encounter: Payer: Self-pay | Admitting: Critical Care Medicine

## 2019-02-04 DIAGNOSIS — E871 Hypo-osmolality and hyponatremia: Secondary | ICD-10-CM

## 2019-02-04 DIAGNOSIS — R945 Abnormal results of liver function studies: Secondary | ICD-10-CM

## 2019-02-04 DIAGNOSIS — E872 Acidosis, unspecified: Secondary | ICD-10-CM

## 2019-02-04 DIAGNOSIS — F1010 Alcohol abuse, uncomplicated: Secondary | ICD-10-CM

## 2019-02-04 DIAGNOSIS — K7010 Alcoholic hepatitis without ascites: Secondary | ICD-10-CM

## 2019-02-04 DIAGNOSIS — Z114 Encounter for screening for human immunodeficiency virus [HIV]: Secondary | ICD-10-CM

## 2019-02-04 DIAGNOSIS — R7989 Other specified abnormal findings of blood chemistry: Secondary | ICD-10-CM

## 2019-02-04 DIAGNOSIS — R55 Syncope and collapse: Secondary | ICD-10-CM

## 2019-02-04 DIAGNOSIS — E860 Dehydration: Secondary | ICD-10-CM

## 2019-02-04 MED ORDER — FAMOTIDINE 20 MG PO TABS: 20.0000 mg | ORAL_TABLET | Freq: Every day | ORAL | 6 refills | Status: DC

## 2019-02-04 MED ORDER — ONDANSETRON HCL 4 MG PO TABS: 4.0000 mg | ORAL_TABLET | Freq: Three times a day (TID) | ORAL | 0 refills | Status: DC | PRN

## 2019-02-04 MED ORDER — CHLORDIAZEPOXIDE HCL 10 MG PO CAPS: 10.0000 mg | ORAL_CAPSULE | Freq: Three times a day (TID) | ORAL | 0 refills | Status: DC

## 2019-02-04 MED ORDER — FOLIC ACID 1 MG PO TABS: 1.0000 mg | ORAL_TABLET | Freq: Every day | ORAL | 6 refills | Status: DC

## 2019-02-04 MED ORDER — VITAMIN B-1 100 MG PO TABS: 100.0000 mg | ORAL_TABLET | Freq: Every day | ORAL | 6 refills | Status: DC

## 2019-02-04 MED ORDER — CALCIUM 600 MG PO TABS: 600.0000 mg | ORAL_TABLET | Freq: Two times a day (BID) | ORAL | 1 refills | Status: DC

## 2019-02-04 MED FILL — FOLIC ACID 1 MG TABS: 1 | 30 days supply | Qty: 30 | Fill #0

## 2019-02-04 MED FILL — CHLORDIAZEPOXIDE 10 MG CAP: 10 | 30 days supply | Qty: 90 | Fill #0

## 2019-02-04 MED FILL — ONDANSETRON HCL 4 MG TABLET: 4 | 20 days supply | Qty: 60 | Fill #0

## 2019-02-05 NOTE — Progress Notes (Signed)
Patient ID: Jack Fernandez, male   DOB: 10/10/1964, 53 y.o.   MRN: [id number] This patient was seen in the [place] clinic on May 27. This is a 53 year old male who has had longstanding alcohol abuse and has noted more recently over the past 6 to 8 weeks episodes of syncope.  This began when it became warmer out of the winter season.  The patient would develop left arm numbness and left leg numbness and then become agitated had ongoing diarrhea nausea and vomiting.  The patient was drinking excessively on a regular basis.  Was not eating on a regular basis or taking other fluids.  The patient presented to the emergency room with these complaints on May 13 and May 17.  Note that also been emergency room visits for altered mental status in December and also an episode of near syncope in August 2016  Of note the CT scan of the head was negative at the last emergency room visit on May 17.  Note for the visits in December 2019 and early May 2020 and then May 17 of 2020 there was elevated liver function tests and evidence for volume depletion.  The patient has had episodes of agitation however there is not been observed physical seizure activity.  The patient would have incontinence of stool and urine with these events.  Patient also notes some low back pain.  His legs feel as though they fall asleep.  Now he has stopped drinking for the past 2 days.  He states triggers for his drinking include stress in the area he is living in.  He does have daily emesis and nausea.  He often skips lunch and will have minimal to eat for dinner.  Labs as outlined below.  BMP Latest Ref Rng & Units 01/25/2019 01/21/2019 08/11/2018  Glucose 70 - 99 mg/dL 98 153(H) 95  BUN 6 - 20 mg/dL _0 Creatinine 0.61 - 1.24 mg/dL 0.52(L) 1.14 0.66  Sodium 135 - 145 mmol/L 131(L) 133(L) 140  Potassium 3.5 - 5.1 mmol/L 3.9 3.9 4.1  Chloride 98 - 111 mmol/L 99 103 103  CO2 22 - 32 mmol/L 19(L) 17(L) 22  Calcium 8.9 - 10.3 mg/dL 7.4(L)  8.2(L) 9.0   Hepatic Function Latest Ref Rng & Units 01/25/2019 01/21/2019 08/11/2018  Total Protein 6.5 - 8.1 g/dL 6.1(L) 7.3 8.7(H)  Albumin 3.5 - 5.0 g/dL 2.4(L) 3.1(L) 4.5  AST 15 - 41 U/L 232(H) 543(H) 46(H)  ALT 0 - 44 U/L 57(H) 109(H) 19  Alk Phosphatase 38 - 126 U/L 70 80 47  Total Bilirubin 0.3 - 1.2 mg/dL 1.8(H) 2.0(H) 0.8   Lab Results  Component Value Date   WBC 4.1 01/25/2019   HGB 12.8 (L) 01/25/2019   HCT 36.2 (L) 01/25/2019   MCV 84.8 01/25/2019   PLT 128 (L) 01/25/2019    On exam blood pressure 110/84 there is some orthostasis down by 10 points when standing saturation 100% room air pulse 104 temp 98 chest was clear cardiac exam showed a regular rate and rhythm abdomen was soft nontender there was no edema extremities showed no clubbing  Abdomen was nontender liver with did not appear to be enlarged  Neurologically the patient was awake and alert he had good strength both in upper and lower extremities with no neurologic deficits noted however he was quite tremulous  Impression is that of ongoing alcohol use with severe alcoholic hepatitis and alcohol withdrawal at this time  No evidence of alcohol withdrawal  seizures but the patient clearly has mild to moderate alcohol withdrawal symptoms and associated volume depletion   Plan will be to prescribe thiamine 300 mg daily folic acid 1 mg daily Zofran 4 mg as needed for nausea Pepcid 20 mg daily calcium supplements daily Librium 10 mg 3 times daily as needed for withdrawal  We will have the patient return to our office for lab draw and an office visit

## 2019-02-06 ENCOUNTER — Other Ambulatory Visit: Payer: Self-pay

## 2019-02-12 ENCOUNTER — Telehealth: Payer: Self-pay | Admitting: General Practice

## 2019-02-12 NOTE — Telephone Encounter (Signed)
Called patient and patient was not available. Left message for patient to return call and schedule an appt with Dr. Delford Field to est care.

## 2019-02-17 ENCOUNTER — Other Ambulatory Visit: Payer: Self-pay | Admitting: *Deleted

## 2019-02-17 DIAGNOSIS — Z20822 Contact with and (suspected) exposure to covid-19: Secondary | ICD-10-CM

## 2019-02-19 LAB — NOVEL CORONAVIRUS, NAA: SARS-CoV-2, NAA: NOT DETECTED

## 2019-05-27 ENCOUNTER — Other Ambulatory Visit: Payer: Self-pay | Admitting: Critical Care Medicine

## 2019-05-27 ENCOUNTER — Encounter: Payer: Self-pay | Admitting: Critical Care Medicine

## 2019-05-27 MED ORDER — VITAMIN B-1 100 MG PO TABS: 100.0000 mg | ORAL_TABLET | Freq: Every day | ORAL | 6 refills | Status: DC

## 2019-05-27 MED ORDER — FOLIC ACID 1 MG PO TABS: 1.0000 mg | ORAL_TABLET | Freq: Every day | ORAL | 6 refills | Status: DC

## 2019-05-27 MED FILL — FOLIC ACID 1 MG TABS: 1 | 30 days supply | Qty: 30 | Fill #0

## 2019-05-27 NOTE — Progress Notes (Unsigned)
Refills on meds 

## 2019-05-28 MED FILL — VITAMIN B1 100 MG TABS: 100 | 30 days supply | Qty: 100 | Fill #0

## 2019-05-28 NOTE — Progress Notes (Signed)
Patient ID: Jack Fernandez, male   DOB: July 17, 1966, 53 y.o.   MRN: [id number]   This is a 53 year old male who was last seen in May at the [place] [facility] and returns today.  He is now a resident of the shelter.  Has a longstanding history of alcoholism with binge drinking.  Below is my note from the visit in May.  This patient was seen in the [place] [facility] on May 27. This is a 53 year old male who has had longstanding alcohol abuse and has noted more recently over the past 6 to 8 weeks episodes of syncope.  This began when it became warmer out of the winter season.  The patient would develop left arm numbness and left leg numbness and then become agitated had ongoing diarrhea nausea and vomiting.  The patient was drinking excessively on a regular basis.  Was not eating on a regular basis or taking other fluids.  The patient presented to the emergency room with these complaints on May 13 and May 17.  Note that also been emergency room visits for altered mental status in December and also an episode of near syncope in August 2016  Of note the CT scan of the head was negative at the last emergency room visit on May 17.  Note for the visits in December 2019 and early May 2020 and then May 17 of 2020 there was elevated liver function tests and evidence for volume depletion.  The patient has had episodes of agitation however there is not been observed physical seizure activity.  The patient would have incontinence of stool and urine with these events.  Patient also notes some low back pain.  His legs feel as though they fall asleep.  Now he has stopped drinking for the past 2 days.  He states triggers for his drinking include stress in the area he is living in.  He does have daily emesis and nausea.  He often skips lunch and will have minimal to eat for dinner.  Since the last visit the patient now is in the shelter and he is much improved.  He is only down about 1 can every 3 or 4 days of beer.  He  states the Librium did help him and he finished that course of therapy.  He has run out of his thiamine and folic acid.  I cannot tell whether he is engaged in alcohol counseling as of yet.  He does have some left hand numbness and some muscle twitching in the left arm.  Below are labs from previous encounters Labs as outlined below.  BMP Latest Ref Rng & Units 01/25/2019 01/21/2019 08/11/2018  Glucose 70 - 99 mg/dL 98 153(H) 95  BUN 6 - 20 mg/dL _0 Creatinine 0.61 - 1.24 mg/dL 0.52(L) 1.14 0.66  Sodium 135 - 145 mmol/L 131(L) 133(L) 140  Potassium 3.5 - 5.1 mmol/L 3.9 3.9 4.1  Chloride 98 - 111 mmol/L 99 103 103  CO2 22 - 32 mmol/L 19(L) 17(L) 22  Calcium 8.9 - 10.3 mg/dL 7.4(L) 8.2(L) 9.0   Hepatic Function Latest Ref Rng & Units 01/25/2019 01/21/2019 08/11/2018  Total Protein 6.5 - 8.1 g/dL 6.1(L) 7.3 8.7(H)  Albumin 3.5 - 5.0 g/dL 2.4(L) 3.1(L) 4.5  AST 15 - 41 U/L 232(H) 543(H) 46(H)  ALT 0 - 44 U/L 57(H) 109(H) 19  Alk Phosphatase 38 - 126 U/L 70 80 47  Total Bilirubin 0.3 - 1.2 mg/dL 1.8(H) 2.0(H) 0.8   Lab Results  Component Value Date   WBC 4.1 01/25/2019   HGB 12.8 (L) 01/25/2019   HCT 36.2 (L) 01/25/2019   MCV 84.8 01/25/2019   PLT 128 (L) 01/25/2019    On exam the patient is very much alert and awake and has good range of motion of all extremities and there is no abnormalities on the neurologic exam that I can tell except for mild tremor in the left hand. Chest is clear cardiac exam was unremarkable  Impression is that of chronic alcoholism with alcoholic hepatitis and alcohol withdrawal syndrome in the past    my plan today will be to refill thiamine and folic acid daily but hold off on further Librium and he no longer needs Zofran for nausea or Pepcid.

## 2019-06-03 ENCOUNTER — Other Ambulatory Visit: Payer: Self-pay | Admitting: Critical Care Medicine

## 2019-06-03 ENCOUNTER — Encounter: Payer: Self-pay | Admitting: Critical Care Medicine

## 2019-06-03 MED ORDER — GABAPENTIN 100 MG PO CAPS: 100.0000 mg | ORAL_CAPSULE | Freq: Three times a day (TID) | ORAL | 0 refills | Status: DC

## 2019-06-03 NOTE — Progress Notes (Signed)
[facility]  Neuropathy

## 2019-06-04 MED FILL — GABAPENTIN 100 MG CAP: 100 | 30 days supply | Qty: 90 | Fill #0

## 2019-06-04 NOTE — Progress Notes (Signed)
Patient ID: Jack Fernandez, male   DOB: 05/01/1966, 53 y.o.   MRN: [id number]   This is a 53 year old male who was last seen in May at the [facility] and returns today.  He is now a resident of the shelter.  Has a longstanding history of alcoholism with binge drinking.  Below is my note from the visit in May.  This patient was seen in the [facility] on May 27. This is a 53 year old male who has had longstanding alcohol abuse and has noted more recently over the past 6 to 8 weeks episodes of syncope.  This began when it became warmer out of the winter season.  The patient would develop left arm numbness and left leg numbness and then become agitated had ongoing diarrhea nausea and vomiting.  The patient was drinking excessively on a regular basis.  Was not eating on a regular basis or taking other fluids.  The patient presented to the emergency room with these complaints on May 13 and May 17.  Note that also been emergency room visits for altered mental status in December and also an episode of near syncope in August 2016  Of note the CT scan of the head was negative at the last emergency room visit on May 17.  Note for the visits in December 2019 and early May 2020 and then May 17 of 2020 there was elevated liver function tests and evidence for volume depletion.  The patient has had episodes of agitation however there is not been observed physical seizure activity.  The patient would have incontinence of stool and urine with these events.  Patient also notes some low back pain.  His legs feel as though they fall asleep.  Now he has stopped drinking for the past 2 days.  He states triggers for his drinking include stress in the area he is living in.  He does have daily emesis and nausea.  He often skips lunch and will have minimal to eat for dinner.  Since the last visit the patient now is in the shelter and he is much improved.  He is only down about 1 can every 3 or 4 days of beer.  He  states the Librium did help him and he finished that course of therapy.  He has run out of his thiamine and folic acid.  I cannot tell whether he is engaged in alcohol counseling as of yet.  He does have some left hand numbness and some muscle twitching in the left arm.  9/23: The patient is seen in follow-up again at the shelter clinic on 06/03/2019.  Documentation from prior visit a week ago is as above  The patient states he is having increased numbness in the left arm he also has pain in the lower back if he sits in a chair for a prolonged periods.  His left leg is also weak and tends to jump at night.  He has numbness in the left hand and also on the right hand.  He has decreased grip in the left hand.  He is right-handed.  He is currently no longer drinking any alcohol but was binge drinking previously.  The patient denies any gastrointestinal symptoms at this time.  The patient's had some of the symptoms for the past 3 years but appear to be getting worse.  He is taking his thiamine and folic acid as prescribed at the last visit.    I Below are labs from previous encounters Labs  as outlined below.  BMP Latest Ref Rng & Units 01/25/2019 01/21/2019 08/11/2018  Glucose 70 - 99 mg/dL 98 153(H) 95  BUN 6 - 20 mg/dL 9 12 7   Creatinine 0.61 - 1.24 mg/dL 0.52(L) 1.14 0.66  Sodium 135 - 145 mmol/L 131(L) 133(L) 140  Potassium 3.5 - 5.1 mmol/L 3.9 3.9 4.1  Chloride 98 - 111 mmol/L 99 103 103  CO2 22 - 32 mmol/L 19(L) 17(L) 22  Calcium 8.9 - 10.3 mg/dL 7.4(L) 8.2(L) 9.0   Hepatic Function Latest Ref Rng & Units 01/25/2019 01/21/2019 08/11/2018  Total Protein 6.5 - 8.1 g/dL 6.1(L) 7.3 8.7(H)  Albumin 3.5 - 5.0 g/dL 2.4(L) 3.1(L) 4.5  AST 15 - 41 U/L 232(H) 543(H) 46(H)  ALT 0 - 44 U/L 57(H) 109(H) 19  Alk Phosphatase 38 - 126 U/L 70 80 47  Total Bilirubin 0.3 - 1.2 mg/dL 1.8(H) 2.0(H) 0.8   Lab Results  Component Value Date   WBC 4.1 01/25/2019   HGB 12.8 (L) 01/25/2019   HCT 36.2 (L)  01/25/2019   MCV 84.8 01/25/2019   PLT 128 (L) 01/25/2019  On exam saturation 94% room air pulse 115 chest was clear cardiac exam unremarkable neurologic exam there is weakness in the left hand with the grip there is numbness in the dorsum and plantar aspects of the left hand the right hand is normal in strength the left leg also has giveaway weakness   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

## 2019-06-09 ENCOUNTER — Ambulatory Visit: Payer: Self-pay | Attending: Critical Care Medicine | Admitting: Critical Care Medicine

## 2019-06-09 ENCOUNTER — Other Ambulatory Visit: Payer: Self-pay

## 2019-06-09 ENCOUNTER — Encounter: Payer: Self-pay | Admitting: Critical Care Medicine

## 2019-06-09 VITALS — BP 101/70 | HR 102 | Temp 98.4°F | Ht 69.0 in | Wt 197.0 lb

## 2019-06-09 DIAGNOSIS — F1090 Alcohol use, unspecified, uncomplicated: Secondary | ICD-10-CM | POA: Insufficient documentation

## 2019-06-09 DIAGNOSIS — Z1159 Encounter for screening for other viral diseases: Secondary | ICD-10-CM

## 2019-06-09 DIAGNOSIS — Z789 Other specified health status: Secondary | ICD-10-CM | POA: Insufficient documentation

## 2019-06-09 DIAGNOSIS — Z7289 Other problems related to lifestyle: Secondary | ICD-10-CM

## 2019-06-09 DIAGNOSIS — M25512 Pain in left shoulder: Secondary | ICD-10-CM | POA: Insufficient documentation

## 2019-06-09 DIAGNOSIS — K7010 Alcoholic hepatitis without ascites: Secondary | ICD-10-CM | POA: Insufficient documentation

## 2019-06-09 DIAGNOSIS — G8929 Other chronic pain: Secondary | ICD-10-CM

## 2019-06-09 DIAGNOSIS — G629 Polyneuropathy, unspecified: Secondary | ICD-10-CM

## 2019-06-09 NOTE — Patient Instructions (Signed)
Continue gabapentin 3 times daily as needed for arm pain and hand pain  An x-ray of your left shoulder will be obtained  Flu vaccine will be given  Labs today include a hepatitis C screen complete metabolic panel complete blood count  Return to Dr Joya Gaskins two months
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
## 2019-06-10 LAB — CBC WITH DIFFERENTIAL/PLATELET
Basophils Absolute: 0.1 10*3/uL (ref 0.0–0.2)
Basos: 1 %
EOS (ABSOLUTE): 0.2 10*3/uL (ref 0.0–0.4)
Eos: 2 %
Hematocrit: 35.7 % — ABNORMAL LOW (ref 37.5–51.0)
Hemoglobin: 12.4 g/dL — ABNORMAL LOW (ref 13.0–17.7)
Immature Grans (Abs): 0 10*3/uL (ref 0.0–0.1)
Immature Granulocytes: 1 %
Lymphocytes Absolute: 2.3 10*3/uL (ref 0.7–3.1)
Lymphs: 35 %
MCH: 28.3 pg (ref 26.6–33.0)
MCHC: 34.7 g/dL (ref 31.5–35.7)
MCV: 82 fL (ref 79–97)
Monocytes Absolute: 0.4 10*3/uL (ref 0.1–0.9)
Monocytes: 6 %
Neutrophils Absolute: 3.7 10*3/uL (ref 1.4–7.0)
Neutrophils: 55 %
Platelets: 210 10*3/uL (ref 150–450)
RBC: 4.38 x10E6/uL (ref 4.14–5.80)
RDW: 12.2 % (ref 11.6–15.4)
WBC: 6.6 10*3/uL (ref 3.4–10.8)

## 2019-06-10 LAB — HEPATITIS C ANTIBODY (REFLEX): HCV Ab: 0.1 s/co ratio (ref 0.0–0.9)

## 2019-06-10 LAB — COMPREHENSIVE METABOLIC PANEL
ALT: 6 IU/L (ref 0–44)
AST: 17 IU/L (ref 0–40)
Albumin/Globulin Ratio: 1.3 (ref 1.2–2.2)
Albumin: 4.4 g/dL (ref 3.8–4.9)
Alkaline Phosphatase: 55 IU/L (ref 39–117)
BUN/Creatinine Ratio: 12 (ref 9–20)
BUN: 10 mg/dL (ref 6–24)
Bilirubin Total: 0.4 mg/dL (ref 0.0–1.2)
CO2: 17 mmol/L — ABNORMAL LOW (ref 20–29)
Calcium: 9.6 mg/dL (ref 8.7–10.2)
Chloride: 103 mmol/L (ref 96–106)
Creatinine, Ser: 0.84 mg/dL (ref 0.76–1.27)
GFR calc Af Amer: 114 mL/min/{1.73_m2} (ref >59)
GFR calc non Af Amer: 99 mL/min/{1.73_m2} (ref >59)
Globulin, Total: 3.3 g/dL (ref 1.5–4.5)
Glucose: 102 mg/dL — ABNORMAL HIGH (ref 65–99)
Potassium: 4 mmol/L (ref 3.5–5.2)
Sodium: 138 mmol/L (ref 134–144)
Total Protein: 7.7 g/dL (ref 6.0–8.5)

## 2019-06-10 LAB — HCV COMMENT:

## 2019-06-10 NOTE — Assessment & Plan Note (Signed)
The patient's made connected to the social worker with regards to his ongoing alcohol use
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
## 2019-06-10 NOTE — Assessment & Plan Note (Signed)
Will assess a metabolic panel

## 2019-06-10 NOTE — Assessment & Plan Note (Signed)
Chronic shoulder pain will obtain x-rays of the left shoulder

## 2019-06-12 ENCOUNTER — Other Ambulatory Visit: Payer: Self-pay

## 2019-06-12 ENCOUNTER — Ambulatory Visit (HOSPITAL_COMMUNITY)
Admission: RE | Admit: 2019-06-12 | Discharge: 2019-06-12 | Disposition: A | Discharge status: Home / Self Care | Payer: Self-pay | Source: Ambulatory Visit | Attending: Critical Care Medicine | Admitting: Critical Care Medicine

## 2019-06-12 DIAGNOSIS — M25512 Pain in left shoulder: Secondary | ICD-10-CM | POA: Insufficient documentation

## 2019-06-12 DIAGNOSIS — G8929 Other chronic pain: Secondary | ICD-10-CM | POA: Insufficient documentation

## 2019-06-17 ENCOUNTER — Ambulatory Visit: Payer: Self-pay | Attending: Family Medicine

## 2019-06-17 ENCOUNTER — Other Ambulatory Visit: Payer: Self-pay

## 2019-06-18 ENCOUNTER — Telehealth: Payer: Self-pay

## 2019-06-18 NOTE — Telephone Encounter (Signed)
-----   Message from Charlott Rakes, MD sent at 06/16/2019  5:30 PM EDT ----- Left shoulder x-ray is unremarkable

## 2019-06-18 NOTE — Telephone Encounter (Signed)
Patient was called and a voicemail was left informing patient to return phone call for lab results. 

## 2019-06-18 NOTE — Telephone Encounter (Signed)
-----   Message from Enobong Newlin, MD sent at 06/16/2019  5:29 PM EDT ----- Labs are stable, hepatitis C is negative.  He does have slight anemia; this will be monitored.  Low platelets have improved back to normal.  Advised to continue folic acid and iron rich foods. 

## 2019-06-23 ENCOUNTER — Telehealth: Payer: Self-pay

## 2019-06-23 NOTE — Telephone Encounter (Signed)
-----   Message from Charlott Rakes, MD sent at 06/16/2019  5:29 PM EDT ----- Labs are stable, hepatitis C is negative.  He does have slight anemia; this will be monitored.  Low platelets have improved back to normal.  Advised to continue folic acid and iron rich foods.

## 2019-06-23 NOTE — Telephone Encounter (Signed)
Patient was called and a voicemail was left informing patient to return phone call for lab results. 

## 2019-06-24 ENCOUNTER — Other Ambulatory Visit: Payer: Self-pay | Admitting: Critical Care Medicine

## 2019-06-24 ENCOUNTER — Encounter: Payer: Self-pay | Admitting: Critical Care Medicine

## 2019-06-24 MED ORDER — GABAPENTIN 300 MG PO CAPS: 300.0000 mg | ORAL_CAPSULE | Freq: Three times a day (TID) | ORAL | 1 refills | Status: DC

## 2019-06-24 MED FILL — GABAPENTIN 300 MG CAPSULE: 300 | 30 days supply | Qty: 90 | Fill #0

## 2019-06-25 ENCOUNTER — Other Ambulatory Visit: Payer: Self-pay | Admitting: Critical Care Medicine

## 2019-06-25 DIAGNOSIS — G629 Polyneuropathy, unspecified: Secondary | ICD-10-CM

## 2019-06-25 NOTE — Progress Notes (Signed)
This is a follow-up homeless shelter clinic visit for this 53 year old male previous documentations are as below    First visit was 02/04/19: This patient was seen in the [place] clinic on May 27. This is a 53 year old male who has had longstanding alcohol abuse and has noted more recently over the past 6 to 8 weeks episodes of syncope.  This began when it became warmer out of the winter season.  The patient would develop left arm numbness and left leg numbness and then become agitated had ongoing diarrhea nausea and vomiting.  The patient was drinking excessively on a regular basis.  Was not eating on a regular basis or taking other fluids.  The patient presented to the emergency room with these complaints on May 13 and May 17.  Note that also been emergency room visits for altered mental status in December and also an episode of near syncope in August 2016  Of note the CT scan of the head was negative at the last emergency room visit on May 17.  Note for the visits in December 2019 and early May 2020 and then May 17 of 2020 there was elevated liver function tests and evidence for volume depletion.  The patient has had episodes of agitation however there is not been observed physical seizure activity.  The patient would have incontinence of stool and urine with these events.  Patient also notes some low back pain.  His legs feel as though they fall asleep.  Now he has stopped drinking for the past 2 days.  He states triggers for his drinking include stress in the area he is living in.  He does have daily emesis and nausea.  He often skips lunch and will have minimal to eat for dinner.  The patient has actually improved with his nausea vomiting and seizure type activity he has had no further episodes of loss of consciousness.  He has no loose stools.  He states the gabapentin has helped his left hand and arm pain.  He still has weakness in the left lower extremity.  He has not been drinking  alcohol.  He is on the gabapentin 100 mg 3 times daily.  He did achieve a visit in the office on September 29.  X-ray of the shoulder was negative.  Lab data showed improvement in liver function.  On exam today he still has weakness in the left hand difficulty fully opening the left hand and a shooting pain going down from the mid shoulder down to the wrist   Concern here is that he has some type of neuropathy related to his alcoholism and despite use of thiamine and folic acid he still is having persistent symptoms  Plan will be to refer to neurology once he achieves an orange card so that he can potentially get an EEG MG nerve conduction velocity study to determine whether he has neuropathy or some type of brachial plexus syndrome  We will increase his gabapentin to 300 mg 3 times daily in the interim

## 2019-07-08 ENCOUNTER — Encounter: Payer: Self-pay | Admitting: Critical Care Medicine

## 2019-07-08 ENCOUNTER — Other Ambulatory Visit: Payer: Self-pay | Admitting: Critical Care Medicine

## 2019-07-08 DIAGNOSIS — G629 Polyneuropathy, unspecified: Secondary | ICD-10-CM

## 2019-07-08 NOTE — Progress Notes (Signed)
Pt needs neuro eval for neuropathy of left hand   Pt has orange card

## 2019-07-09 NOTE — Progress Notes (Signed)
Patient ID: Jack Fernandez, male   DOB: 03/13/1966, 53 y.o.   MRN: [id number]  I connected with this patient at the [place] clinic and he continues to have arm pain and weakness in the left hand.  He now has an orange card and so we will make a referral to neurology for this patient.

## 2019-07-15 ENCOUNTER — Other Ambulatory Visit: Payer: Self-pay | Admitting: Critical Care Medicine

## 2019-07-15 ENCOUNTER — Encounter: Payer: Self-pay | Admitting: Critical Care Medicine

## 2019-07-15 DIAGNOSIS — I10 Essential (primary) hypertension: Secondary | ICD-10-CM | POA: Insufficient documentation

## 2019-07-15 MED ORDER — AMLODIPINE BESYLATE 5 MG PO TABS: 5.0000 mg | ORAL_TABLET | Freq: Every day | ORAL | 3 refills | Status: DC

## 2019-07-15 MED FILL — ?AMLODIPINE BESYLATE 5 MG T: 5 MG | 30 days supply | Qty: 30 | Fill #0

## 2019-07-15 NOTE — Progress Notes (Signed)
Amlodipine 5mg  daily for HTN  BP 152/82

## 2019-07-15 NOTE — Progress Notes (Signed)
Patient ID: Jack Fernandez, male   DOB: 1965/09/21, 53 y.o.   MRN: [id number]  The patient came to the [facility] today asking for evaluation of blood pressure.  The patient's concerned about increasing levels of the blood pressure.  Note he does have an upcoming neurology visit for his left arm pain.  On exam blood pressure is 152/82.  Plan will be to prescribe amlodipine 5 mg daily and we will see the patient back in the clinic for further follow-up of hypertension

## 2019-07-22 MED FILL — FOLIC ACID 1 MG TABS: 1 | 30 days supply | Qty: 30 | Fill #0

## 2019-07-22 MED FILL — GABAPENTIN 300 MG CAPSULE: 300 | 30 days supply | Qty: 90 | Fill #1

## 2019-08-03 ENCOUNTER — Other Ambulatory Visit (INDEPENDENT_AMBULATORY_CARE_PROVIDER_SITE_OTHER): Payer: Self-pay

## 2019-08-03 ENCOUNTER — Other Ambulatory Visit: Payer: Self-pay

## 2019-08-03 ENCOUNTER — Encounter: Payer: Self-pay | Admitting: Neurology

## 2019-08-03 ENCOUNTER — Ambulatory Visit (INDEPENDENT_AMBULATORY_CARE_PROVIDER_SITE_OTHER): Payer: Self-pay | Admitting: Neurology

## 2019-08-03 VITALS — BP 124/86 | HR 116 | Ht 69.0 in | Wt 204.0 lb

## 2019-08-03 DIAGNOSIS — G621 Alcoholic polyneuropathy: Secondary | ICD-10-CM

## 2019-08-03 DIAGNOSIS — G959 Disease of spinal cord, unspecified: Secondary | ICD-10-CM

## 2019-08-03 DIAGNOSIS — R29898 Other symptoms and signs involving the musculoskeletal system: Secondary | ICD-10-CM

## 2019-08-03 NOTE — Patient Instructions (Signed)
MRI cervical spine without contrast  MRI lumbar spine without contrast  Check labs
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
## 2019-08-04 ENCOUNTER — Telehealth: Payer: Self-pay

## 2019-08-04 LAB — B12 AND FOLATE PANEL
Folate: 20.8 ng/mL
Vitamin B-12: 359 pg/mL (ref 200–1100)

## 2019-08-04 NOTE — Telephone Encounter (Signed)
Patient aware of normal results

## 2019-08-04 NOTE — Telephone Encounter (Signed)
-----   Message from Alda Berthold, DO sent at 08/04/2019  8:06 AM EST ----- Please notify patient lab are within normal limits.  Thank you.

## 2019-08-10 ENCOUNTER — Ambulatory Visit: Payer: Self-pay | Admitting: Neurology

## 2019-08-11 ENCOUNTER — Other Ambulatory Visit: Payer: Self-pay

## 2019-08-11 ENCOUNTER — Ambulatory Visit: Payer: Self-pay | Attending: Critical Care Medicine | Admitting: Critical Care Medicine

## 2019-08-11 ENCOUNTER — Encounter: Payer: Self-pay | Admitting: Critical Care Medicine

## 2019-08-11 VITALS — BP 131/87 | HR 110 | Temp 98.6°F | Resp 16 | Ht 69.0 in | Wt 210.6 lb

## 2019-08-11 DIAGNOSIS — Z7289 Other problems related to lifestyle: Secondary | ICD-10-CM

## 2019-08-11 DIAGNOSIS — R000 Tachycardia, unspecified: Secondary | ICD-10-CM | POA: Insufficient documentation

## 2019-08-11 DIAGNOSIS — M545 Low back pain: Secondary | ICD-10-CM | POA: Insufficient documentation

## 2019-08-11 DIAGNOSIS — Z590 Homelessness: Secondary | ICD-10-CM | POA: Insufficient documentation

## 2019-08-11 DIAGNOSIS — R159 Full incontinence of feces: Secondary | ICD-10-CM | POA: Insufficient documentation

## 2019-08-11 DIAGNOSIS — Z79899 Other long term (current) drug therapy: Secondary | ICD-10-CM | POA: Insufficient documentation

## 2019-08-11 DIAGNOSIS — I10 Essential (primary) hypertension: Secondary | ICD-10-CM | POA: Insufficient documentation

## 2019-08-11 DIAGNOSIS — G8929 Other chronic pain: Secondary | ICD-10-CM | POA: Insufficient documentation

## 2019-08-11 DIAGNOSIS — R531 Weakness: Secondary | ICD-10-CM | POA: Insufficient documentation

## 2019-08-11 DIAGNOSIS — G629 Polyneuropathy, unspecified: Secondary | ICD-10-CM | POA: Insufficient documentation

## 2019-08-11 DIAGNOSIS — R55 Syncope and collapse: Secondary | ICD-10-CM | POA: Insufficient documentation

## 2019-08-11 DIAGNOSIS — R252 Cramp and spasm: Secondary | ICD-10-CM | POA: Insufficient documentation

## 2019-08-11 DIAGNOSIS — Z789 Other specified health status: Secondary | ICD-10-CM

## 2019-08-11 DIAGNOSIS — Z7901 Long term (current) use of anticoagulants: Secondary | ICD-10-CM | POA: Insufficient documentation

## 2019-08-11 DIAGNOSIS — R292 Abnormal reflex: Secondary | ICD-10-CM | POA: Insufficient documentation

## 2019-08-11 DIAGNOSIS — M25512 Pain in left shoulder: Secondary | ICD-10-CM | POA: Insufficient documentation

## 2019-08-11 MED FILL — ?FLUOXETINE HCL 20MG TABLE: 20 | 30 days supply | Qty: 30 | Fill #0

## 2019-08-11 MED FILL — QUETIAPINE FUMARATE 50 MG T: 50 | 30 days supply | Qty: 30 | Fill #0
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
## 2019-08-11 NOTE — Assessment & Plan Note (Signed)
Per neurology the patient appears to have some type of neuropathy of the upper extremities and on MRI of the cervical spine is being ordered before nerve conduction velocity studies obtained  The patient also has lower back dermatomal distribution for significant lumbar disease involving the left leg which is causing increased fall risk  MRI of the lumbar spine is pending

## 2019-08-11 NOTE — Assessment & Plan Note (Signed)
Proved control on amlodipine will continue same at 5 mg daily

## 2019-08-11 NOTE — Assessment & Plan Note (Signed)
Recurrent syncope with resting sinus tachycardia otherwise normal EKG  We will order an echocardiogram for further assessment

## 2019-08-11 NOTE — Assessment & Plan Note (Signed)
The chronic left arm and shoulder pain may in fact be on the basis of the cervical spine disease therefore pending MRI of the cervical spine

## 2019-08-11 NOTE — Patient Instructions (Signed)
We will get your MRI as scheduled of your neck and lower back  We are going to get an ultrasound of your heart to evaluate your heart muscle  No change in medications for now  Follow-up here with Dr. Joya Gaskins will be in 2 months but Dr Joya Gaskins will check in with you at the [facility] with my Wednesday afternoon clinics on these results

## 2019-08-12 ENCOUNTER — Other Ambulatory Visit: Payer: Self-pay | Admitting: Critical Care Medicine

## 2019-08-12 ENCOUNTER — Ambulatory Visit (HOSPITAL_COMMUNITY)
Admission: RE | Admit: 2019-08-12 | Discharge: 2019-08-12 | Disposition: A | Discharge status: Home / Self Care | Payer: Self-pay | Source: Ambulatory Visit | Attending: Critical Care Medicine | Admitting: Critical Care Medicine

## 2019-08-12 DIAGNOSIS — Z7289 Other problems related to lifestyle: Secondary | ICD-10-CM

## 2019-08-12 DIAGNOSIS — R55 Syncope and collapse: Secondary | ICD-10-CM | POA: Insufficient documentation

## 2019-08-12 DIAGNOSIS — Z789 Other specified health status: Secondary | ICD-10-CM

## 2019-08-12 DIAGNOSIS — I10 Essential (primary) hypertension: Secondary | ICD-10-CM | POA: Insufficient documentation

## 2019-08-12 MED ORDER — QUETIAPINE FUMARATE 50 MG PO TABS: 50.0000 mg | ORAL_TABLET | Freq: Every day | ORAL | Status: DC

## 2019-08-12 MED ORDER — PAROXETINE HCL 20 MG PO TABS: 20.0000 mg | ORAL_TABLET | Freq: Every day | ORAL | Status: DC

## 2019-08-12 NOTE — Progress Notes (Signed)
Echocardiogram 2D Echocardiogram has been performed.  Jack Fernandez [name] 08/12/2019, 10:04 AM

## 2019-08-18 ENCOUNTER — Ambulatory Visit (HOSPITAL_COMMUNITY): Admission: RE | Admit: 2019-08-18 | Payer: Self-pay | Source: Ambulatory Visit

## 2019-08-18 ENCOUNTER — Ambulatory Visit (HOSPITAL_COMMUNITY): Payer: Self-pay

## 2019-08-19 ENCOUNTER — Encounter: Payer: Self-pay | Admitting: Critical Care Medicine

## 2019-08-19 ENCOUNTER — Other Ambulatory Visit: Payer: Self-pay | Admitting: Critical Care Medicine

## 2019-08-19 MED ORDER — GABAPENTIN 300 MG PO CAPS: 300.0000 mg | ORAL_CAPSULE | Freq: Three times a day (TID) | ORAL | 1 refills | Status: DC

## 2019-08-19 MED FILL — GABAPENTIN 300 MG CAPSULE: 300 | 30 days supply | Qty: 90 | Fill #0

## 2019-08-19 NOTE — Progress Notes (Signed)
Refill gabapentin 

## 2019-08-20 NOTE — Progress Notes (Signed)
Patient ID: Jack Fernandez, male   DOB: [birth year], 53 y.o.   MRN: [id number] This patient needs a refill on gabapentin and we did refill this at this clinic date I gave him a 28-month supply of his dosing which is 300 mg 3 times daily for neuropathy

## 2019-08-24 ENCOUNTER — Ambulatory Visit (HOSPITAL_COMMUNITY)
Admission: RE | Admit: 2019-08-24 | Discharge: 2019-08-24 | Disposition: A | Discharge status: Home / Self Care | Payer: Self-pay | Source: Ambulatory Visit | Attending: Neurology | Admitting: Neurology

## 2019-08-24 ENCOUNTER — Telehealth: Payer: Self-pay | Admitting: Neurology

## 2019-08-24 ENCOUNTER — Other Ambulatory Visit: Payer: Self-pay

## 2019-08-24 DIAGNOSIS — G959 Disease of spinal cord, unspecified: Secondary | ICD-10-CM | POA: Insufficient documentation

## 2019-08-24 DIAGNOSIS — M48062 Spinal stenosis, lumbar region with neurogenic claudication: Secondary | ICD-10-CM

## 2019-08-24 DIAGNOSIS — R29898 Other symptoms and signs involving the musculoskeletal system: Secondary | ICD-10-CM

## 2019-08-24 DIAGNOSIS — M4802 Spinal stenosis, cervical region: Secondary | ICD-10-CM

## 2019-08-24 DIAGNOSIS — G621 Alcoholic polyneuropathy: Secondary | ICD-10-CM

## 2019-08-24 NOTE — Telephone Encounter (Signed)
Cervical spine finding indicated a myelomlacia from c3-c5 . Cervical spinal stenosis c3-c5 with bilater  forminal narrowing and moderated canal stenosis. Fax report to come over, Jack Fernandez received the verbal report.

## 2019-08-25 MED FILL — FOLIC ACID 1 MG TABS: 1 | 30 days supply | Qty: 30 | Fill #1

## 2019-08-25 NOTE — Telephone Encounter (Signed)
Called and discussed results of MRI cervical and lumbar spine with patient.  There is severe cervical canal stenosis ans bilateral neural foraminal stenosis from C3-5 along with myelomalacia.  Lumbar spine imaging shows spinal stenosis involving the lumbar spine at L4-5 and L5-S1. Patient will be referred to spine surgery ASAP for evaluation for surgical decompression.

## 2019-08-25 NOTE — Telephone Encounter (Signed)
Report to follow over

## 2019-08-25 NOTE — Telephone Encounter (Signed)
Wow thanks you [employer]

## 2019-08-26 ENCOUNTER — Encounter: Payer: Self-pay | Admitting: Critical Care Medicine

## 2019-08-27 ENCOUNTER — Telehealth: Payer: Self-pay

## 2019-08-27 NOTE — Telephone Encounter (Signed)
-----   Message from Alda Berthold, DO sent at 08/27/2019 11:28 AM EST ----- Can you call patient and let them know that [facility] has been trying to reach him to schedule appointment?  It's urgent that he see them.  He can call Marga Hoots at [phone].  Thanks

## 2019-08-27 NOTE — Telephone Encounter (Signed)
Left message for patient to contact [facility] and number given.

## 2019-08-31 ENCOUNTER — Other Ambulatory Visit: Payer: Self-pay

## 2019-08-31 DIAGNOSIS — Z20822 Contact with and (suspected) exposure to covid-19: Secondary | ICD-10-CM

## 2019-09-01 ENCOUNTER — Encounter: Payer: Self-pay | Admitting: Orthopaedic Surgery

## 2019-09-01 ENCOUNTER — Ambulatory Visit (INDEPENDENT_AMBULATORY_CARE_PROVIDER_SITE_OTHER): Payer: Self-pay | Admitting: Orthopaedic Surgery

## 2019-09-01 ENCOUNTER — Other Ambulatory Visit: Payer: Self-pay

## 2019-09-01 DIAGNOSIS — M4712 Other spondylosis with myelopathy, cervical region: Secondary | ICD-10-CM

## 2019-09-01 DIAGNOSIS — M48061 Spinal stenosis, lumbar region without neurogenic claudication: Secondary | ICD-10-CM

## 2019-09-01 DIAGNOSIS — M4802 Spinal stenosis, cervical region: Secondary | ICD-10-CM

## 2019-09-01 LAB — NOVEL CORONAVIRUS, NAA: SARS-CoV-2, NAA: NOT DETECTED

## 2019-09-02 ENCOUNTER — Encounter: Payer: Self-pay | Admitting: Orthopaedic Surgery

## 2019-09-02 DIAGNOSIS — M48061 Spinal stenosis, lumbar region without neurogenic claudication: Secondary | ICD-10-CM | POA: Insufficient documentation

## 2019-09-02 DIAGNOSIS — M4712 Other spondylosis with myelopathy, cervical region: Secondary | ICD-10-CM | POA: Insufficient documentation

## 2019-09-02 DIAGNOSIS — M4802 Spinal stenosis, cervical region: Secondary | ICD-10-CM | POA: Insufficient documentation
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
## 2019-09-07 MED FILL — QUETIAPINE FUMARATE 50 MG T: 50 | 30 days supply | Qty: 30 | Fill #1

## 2019-09-07 MED FILL — FLUoxetine HCL 20 MG TABS: 20 | 30 days supply | Qty: 30 | Fill #1
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
## 2019-09-11 IMAGING — DX DG CHEST 1V PORT
1 series · 1 of 1 positions shown · non-contrast
Comparison: 04/26/2015

CLINICAL DATA: Left leg numbness.  Altered mental status.

EXAM:
PORTABLE CHEST 1 VIEW

[chest ap]
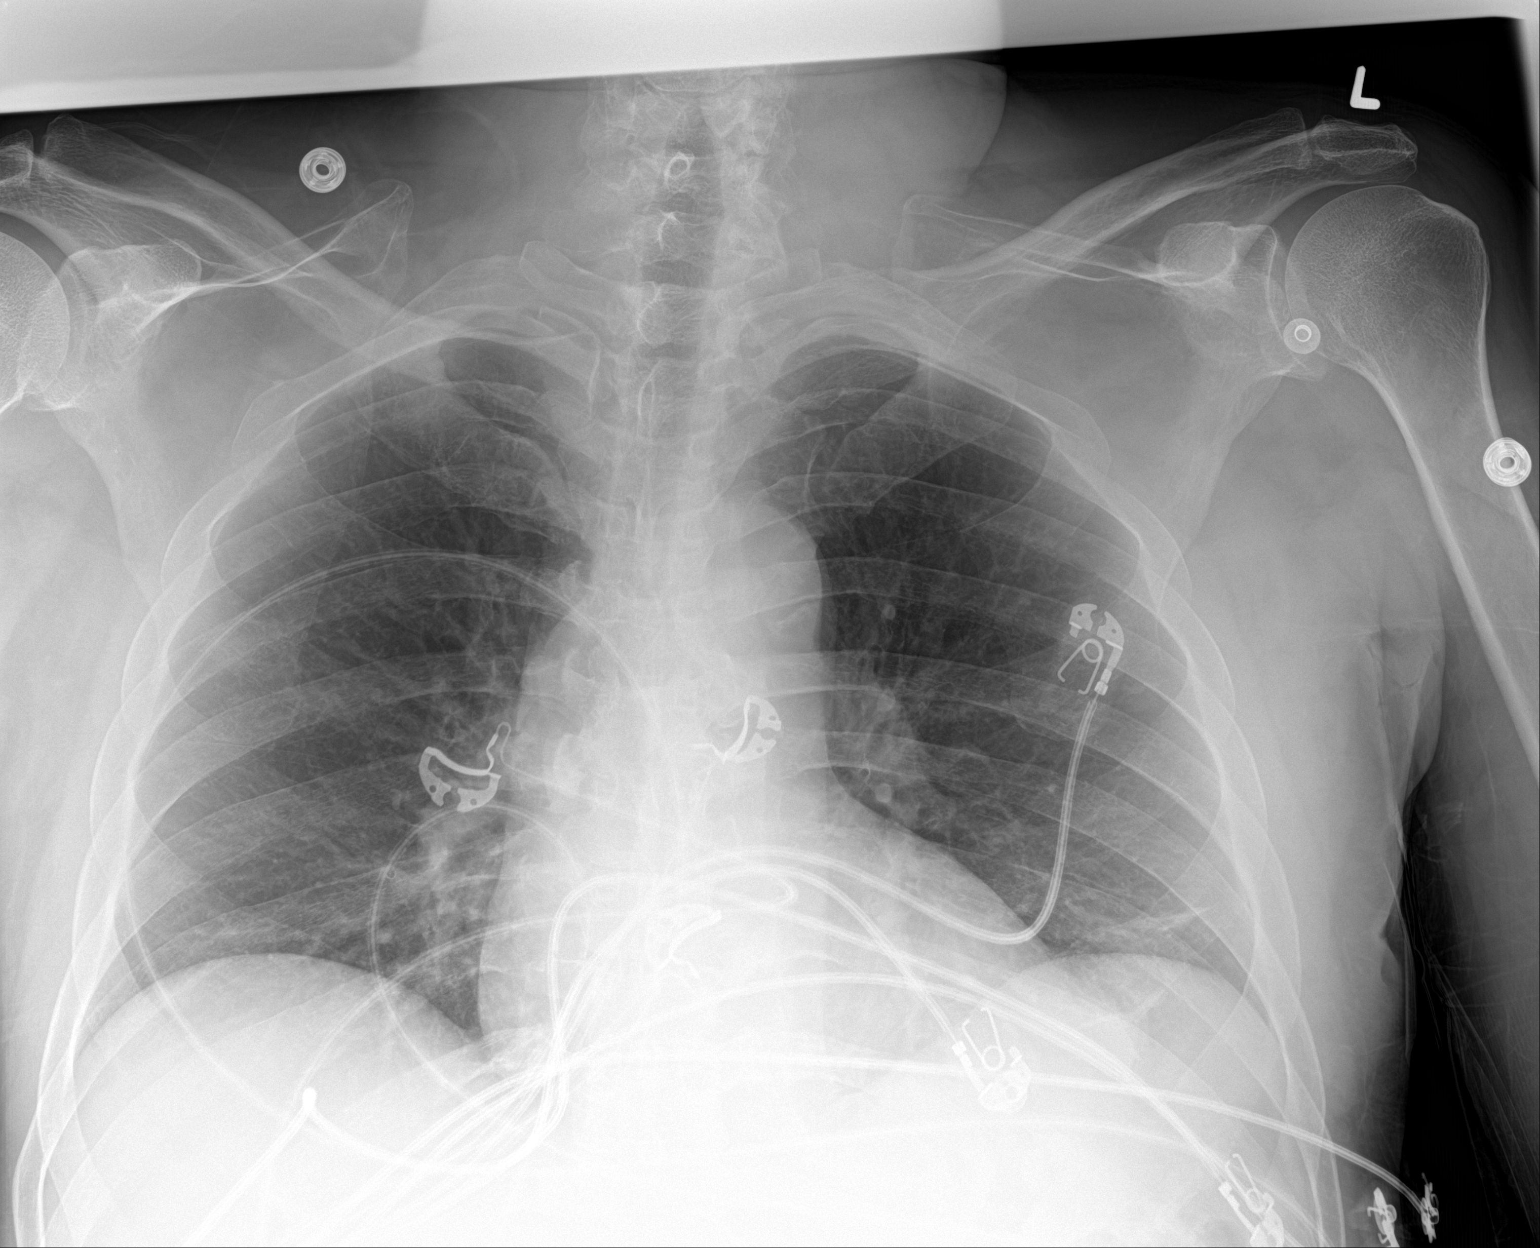

[1 of 1 positions shown; findings below may reference images not displayed]

FINDINGS: The heart size and mediastinal contours are within normal limits.
Unchanged scar like density within the left base. The visualized
skeletal structures are unremarkable.
IMPRESSION: No active disease.

## 2019-09-15 NOTE — Progress Notes (Signed)
Secure chat messaged Zonia Kief, PA-C 09/15/19 @ 1515 for orders.

## 2019-09-16 ENCOUNTER — Encounter: Payer: Self-pay | Admitting: Critical Care Medicine

## 2019-09-16 ENCOUNTER — Telehealth: Payer: Self-pay | Admitting: Radiology

## 2019-09-16 ENCOUNTER — Other Ambulatory Visit: Payer: Self-pay

## 2019-09-16 ENCOUNTER — Encounter (HOSPITAL_COMMUNITY): Payer: Self-pay

## 2019-09-16 ENCOUNTER — Ambulatory Visit (HOSPITAL_COMMUNITY)
Admission: RE | Admit: 2019-09-16 | Discharge: 2019-09-16 | Disposition: A | Discharge status: Home / Self Care | Payer: Medicaid Other | Source: Ambulatory Visit | Attending: Surgery | Admitting: Surgery

## 2019-09-16 ENCOUNTER — Other Ambulatory Visit: Payer: Self-pay | Admitting: Critical Care Medicine

## 2019-09-16 ENCOUNTER — Encounter (HOSPITAL_COMMUNITY)
Admission: RE | Admit: 2019-09-16 | Discharge: 2019-09-16 | Disposition: A | Discharge status: Home / Self Care | Payer: Medicaid Other | Source: Ambulatory Visit | Attending: Orthopaedic Surgery | Admitting: Orthopaedic Surgery

## 2019-09-16 DIAGNOSIS — R9389 Abnormal findings on diagnostic imaging of other specified body structures: Secondary | ICD-10-CM

## 2019-09-16 DIAGNOSIS — Z01818 Encounter for other preprocedural examination: Secondary | ICD-10-CM

## 2019-09-16 HISTORY — DX: Other amnesia: R41.3

## 2019-09-16 HISTORY — DX: Alcoholic hepatitis without ascites: K70.10

## 2019-09-16 HISTORY — DX: Anxiety disorder, unspecified: F41.9

## 2019-09-16 HISTORY — DX: Depression, unspecified: F32.A

## 2019-09-16 HISTORY — DX: Essential (primary) hypertension: I10

## 2019-09-16 HISTORY — DX: Myoneural disorder, unspecified: G70.9

## 2019-09-16 HISTORY — DX: Bipolar disorder, unspecified: F31.9

## 2019-09-16 HISTORY — DX: Dependence on other enabling machines and devices: Z99.89

## 2019-09-16 LAB — CBC
HCT: 41.1 % (ref 39.0–52.0)
Hemoglobin: 14.7 g/dL (ref 13.0–17.0)
MCH: 30.2 pg (ref 26.0–34.0)
MCHC: 35.8 g/dL (ref 30.0–36.0)
MCV: 84.6 fL (ref 80.0–100.0)
Platelets: 200 10*3/uL (ref 150–400)
RBC: 4.86 MIL/uL (ref 4.22–5.81)
RDW: 12 % (ref 11.5–15.5)
WBC: 5.1 10*3/uL (ref 4.0–10.5)
nRBC: 0.4 % — ABNORMAL HIGH (ref 0.0–0.2)

## 2019-09-16 LAB — COMPREHENSIVE METABOLIC PANEL
ALT: 21 U/L (ref 0–44)
AST: 148 U/L — ABNORMAL HIGH (ref 15–41)
Albumin: 4 g/dL (ref 3.5–5.0)
Alkaline Phosphatase: 62 U/L (ref 38–126)
Anion gap: 15 (ref 5–15)
BUN: 12 mg/dL (ref 6–20)
CO2: 17 mmol/L — ABNORMAL LOW (ref 22–32)
Calcium: 8.6 mg/dL — ABNORMAL LOW (ref 8.9–10.3)
Chloride: 100 mmol/L (ref 98–111)
Creatinine, Ser: 0.89 mg/dL (ref 0.61–1.24)
GFR calc Af Amer: >60 mL/min (ref >60)
GFR calc non Af Amer: >60 mL/min (ref >60)
Glucose, Bld: 91 mg/dL (ref 70–99)
Potassium: 5.1 mmol/L (ref 3.5–5.1)
Sodium: 132 mmol/L — ABNORMAL LOW (ref 135–145)
Total Bilirubin: 2.4 mg/dL — ABNORMAL HIGH (ref 0.3–1.2)
Total Protein: 6.9 g/dL (ref 6.5–8.1)

## 2019-09-16 LAB — URINALYSIS, ROUTINE W REFLEX MICROSCOPIC
Bilirubin Urine: NEGATIVE
Glucose, UA: NEGATIVE mg/dL
Hgb urine dipstick: NEGATIVE
Ketones, ur: NEGATIVE mg/dL
Leukocytes,Ua: NEGATIVE
Nitrite: NEGATIVE
Protein, ur: NEGATIVE mg/dL
Specific Gravity, Urine: 1.013 (ref 1.005–1.030)
pH: 5 (ref 5.0–8.0)

## 2019-09-16 LAB — TYPE AND SCREEN
ABO/RH(D): O POS
Antibody Screen: NEGATIVE

## 2019-09-16 LAB — ABO/RH: ABO/RH(D): O POS

## 2019-09-16 LAB — SURGICAL PCR SCREEN
MRSA, PCR: NEGATIVE
Staphylococcus aureus: NEGATIVE

## 2019-09-16 MED ORDER — GABAPENTIN 300 MG PO CAPS: 300.0000 mg | ORAL_CAPSULE | Freq: Three times a day (TID) | ORAL | 1 refills | Status: DC

## 2019-09-16 MED FILL — GABAPENTIN 300 MG CAPSULE: 300 | 30 days supply | Qty: 90 | Fill #0

## 2019-09-16 NOTE — Progress Notes (Addendum)
[id number] [place], [place] - [address] [place] [address] Phone: [phone] Fax: [phone]  [facility] - [place], [place] - [address] [facility]. [address] [place] [address] Phone: [phone] Fax: [phone]  [facility] - [place], [place] - [address] [place] [address] Phone: [phone] Fax: [phone]      Your procedure is scheduled on Wednesday, 09/23/19.  Report to [facility] Main Entrance "A" at 10:30 A.M., and check in at the Admitting office.  Call this number if you have problems the morning of surgery:  [phone]  Call [phone] if you have any questions prior to your surgery date Monday-Friday 8am-4pm    Remember:  Do not eat after midnight the night before your surgery. (Tuesday)  You may have clear liquids til 9:30 am  Tuesday, day of surgery.  Clears: Flavor water or plain water, black coffee/tea with sugar only (No milk or creamer), Carbonated beverages,Cranberry, grape and apple juices Sports drinks like Gatorade  Please complete your PRE-SURGERY ENSURE that was provided to you by 9:30 am. the morning of surgery.  Please, if able, drink it in one setting. DO NOT SIP.   Take these medicines the morning of surgery with A SIP OF WATER: amLODipine (NORVASC)  FLUoxetine (PROZAC)  gabapentin (NEURONTIN)  PARoxetine (PAXIL)    7 days prior to surgery STOP taking any Aspirin (unless otherwise instructed by your surgeon), Aleve, Naproxen, Ibuprofen, Motrin, Advil, Goody's, BC's, all herbal medications, fish oil, and all vitamins.    The Morning of Surgery  Do not wear jewelry.  Do not wear lotions, powders, colognes or deodorant  Men may shave face and neck.  Do not bring valuables to the hospital.  The [facility] is not responsible for any belongings or valuables.  If you are a smoker, DO NOT Smoke 24 hours prior to  surgery  If you wear a CPAP at night please bring your mask, tubing, and machine the morning of surgery   Remember that you must have someone to transport you home after your surgery, and remain with you for 24 hours if you are discharged the same day.   Please bring cases for contacts, glasses, hearing aids, dentures or bridgework because it cannot be worn into surgery.   Leave your suitcase in the car.  After surgery it may be brought to your room.  For patients admitted to the hospital, discharge time will be determined by your treatment team.  Patients discharged the day of surgery will not be allowed to drive home.    Special instructions:   [facility]- Preparing For Surgery  Before surgery, you can play an important role. Because skin is not sterile, your skin needs to be as free of germs as possible. You can reduce the number of germs on your skin by washing with CHG (chlorahexidine gluconate) Soap before surgery.  CHG is an antiseptic cleaner which kills germs and bonds with the skin to continue killing germs even after washing.    Oral Hygiene is also important to reduce your risk of infection.  Remember - BRUSH YOUR TEETH THE MORNING OF SURGERY WITH YOUR REGULAR TOOTHPASTE  Please do not use if you have an allergy to CHG or antibacterial soaps. If your skin becomes reddened/irritated stop using the CHG.  Do not shave (including legs and underarms) for at least 48 hours prior to first CHG shower. It is OK to shave  your face.  Please follow these instructions carefully.   1. Shower the NIGHT BEFORE SURGERY Tues and the MORNING OF SURGERY Wed with CHG Soap.   2. If you chose to wash your hair, wash your hair first as usual with your normal shampoo.  3. After you shampoo, rinse your hair and body thoroughly to remove the shampoo.  4. Use CHG as you would any other liquid soap. You can apply CHG directly to the skin and wash gently with a scrungie or a clean washcloth.    5. Apply the CHG Soap to your body ONLY FROM THE NECK DOWN.  Do not use on open wounds or open sores. Avoid contact with your eyes, ears, mouth and genitals (private parts). Wash Face and genitals (private parts)  with your normal soap.   6. Wash thoroughly, paying special attention to the area where your surgery will be performed.  7. Thoroughly rinse your body with warm water from the neck down.  8. DO NOT shower/wash with your normal soap after using and rinsing off the CHG Soap.  9. Pat yourself dry with a CLEAN TOWEL.  10. Wear CLEAN PAJAMAS to bed the night before surgery, wear comfortable clothes the morning of surgery  11. Place CLEAN SHEETS on your bed the night of your first shower and DO NOT SLEEP WITH PETS.    Day of Surgery:  Please shower the morning of surgery with the CHG soap Do not apply any deodorants/lotions. Please wear clean clothes to the hospital/surgery center.   Remember to brush your teeth WITH YOUR REGULAR TOOTHPASTE.   Please read over the following fact sheets that you were given.

## 2019-09-16 NOTE — Progress Notes (Signed)
Patient denies shortness of breath, fever, cough and chest pain.  PCP - Dr. Shan Levans Cardiologist - denies  Chest x-ray - 01/21/19, 1 view EKG - 01/26/19 Stress Test - denies ECHO - 08/12/19 Cardiac Cath - denies  Anesthesia review: Yes  ERAS: Clears til 9:30 am DOS, Ensure Drink given to be completed by 9:30 am DOS.  Coronavirus Screening Have you experienced the following symptoms:  Cough yes/no: No Fever (>100.87F)  yes/no: No Runny nose yes/no: No Sore throat yes/no: No Difficulty breathing/shortness of breath  yes/no: No  Have you traveled in the last 14 days and where? yes/no: No  Patient verbalized understanding of instructions that were given to them at the PAT appointment.

## 2019-09-16 NOTE — Telephone Encounter (Signed)
Received call from Zonia Kief, PA-C that patient had abnormal chest xray pre-op and CT Chest is recommended.  CT Chest without contrast to evaluate opacity seen on xray entered STAT.

## 2019-09-17 NOTE — Progress Notes (Signed)
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 Review of labs shows intermittently elevated LFTs. Preop labs show AST 148, ALT 21, T bili 2.4, mild hypoNA with sodium 132. Review of PCP notes indicates pt has previously experienced etoh withdrawal symptoms and has had periods of abstinence and relapse. AST  Date Value  09/16/2019 148 U/L (H)  06/09/2019 17 IU/L  01/25/2019 232 U/L (H)  01/21/2019 543 U/L (H)  08/11/2018 46 U/L (H)   ALT  Date Value  09/16/2019 21 U/L  06/09/2019 6 IU/L  01/25/2019 57 U/L (H)  01/21/2019 109 U/L (H)  08/11/2018 19 U/L   Pt was recently referred to neurology for bilateral UE paresthesias. Cervical MRI 08/24/19 showed severe central stenosis with myelomalacia from C3 through C5. Pt was urgently referred for surgical consultation.   Proep CXR with focal 3cm airspace abnormality noted, followup CT recommended and has been ordered by Dr. Ophelia Charter' office.   Case discussed with Dr. Bradley Ferris, no need to repeat LFTs on DOS and CT chest does not have to be completed prior to surgery.  EKG 01/25/19: Sinus rhythm. Rate 67. Atrial premature complexes. No significant change since last tracing  TTE 08/12/19:  1. Left ventricular ejection fraction, by visual estimation, is 60 to 65%. The left ventricle has normal function. There is mildly increased left ventricular hypertrophy.  2. Left ventricular diastolic parameters are consistent with Grade I diastolic dysfunction (impaired relaxation).  3. The left ventricle has no regional wall motion abnormalities.  4. Global right ventricle has normal systolic function.The right ventricular size is normal. No increase in right ventricular wall thickness.  5. Left atrial size was normal.  6. Right atrial size was normal.  7. The mitral valve is grossly normal. Trace mitral valve regurgitation.  8. The tricuspid valve is grossly normal. Tricuspid  valve regurgitation is trivial.  9. The aortic valve is tricuspid. Aortic valve regurgitation is not visualized. 10. The pulmonic valve was grossly normal. Pulmonic valve regurgitation is not visualized. 11. Aortic dilatation noted. 12. There is mild dilatation of the ascending aorta measuring 39 mm. 13. Normal pulmonary artery systolic pressure. 14. The inferior vena cava is normal in size with greater than 50% respiratory variability, suggesting right atrial pressure of 3 mmHg.  [facility] Short Stay Center/Anesthesiology Phone [phone] 09/18/2019 8:06 AM

## 2019-09-18 ENCOUNTER — Other Ambulatory Visit: Payer: Self-pay | Admitting: Radiology

## 2019-09-18 ENCOUNTER — Telehealth: Payer: Self-pay | Admitting: Radiology

## 2019-09-18 ENCOUNTER — Telehealth: Payer: Self-pay | Admitting: Surgery

## 2019-09-18 DIAGNOSIS — M4712 Other spondylosis with myelopathy, cervical region: Secondary | ICD-10-CM

## 2019-09-18 DIAGNOSIS — M4802 Spinal stenosis, cervical region: Secondary | ICD-10-CM
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
## 2019-09-18 NOTE — Telephone Encounter (Signed)
I contacted April Pait with [facility] scheduling and they are unable to get pt by phone:  Please see TEAMS notes communication attached:   [10:55 AM] Elvina Mattes     Can someone from your team call Mr. Dragon Thrush again and try to get him scheduled per Zonia Kief PA, this is a STAT--your team called on 09/16/19 and the pt was suppose to call back per notes from shelter but no call yet. Thanks [33:38 AM] Pait, April     He has to call from the shelter phone.  When we call, we are unable to s/w him directly.  Please also see appt notes in epic on 09/16/19 from scheduling where they attempted to contact pt.

## 2019-09-18 NOTE — Telephone Encounter (Signed)
Jack Kief, PA-C checking on status of patient's STAT CT Chest that has been ordered. Per notes in Epic, the shelter was called by scheduling and spoke with Lyda Perone who told them the resident would have to call back from his own phone. Scheduling has not received a call back from patient. James aware. Patient is scheduled for surgery next week.

## 2019-09-18 NOTE — Telephone Encounter (Signed)
September 16, 2019  I was contacted by the [facility] radiology and given the results of a preop chest x-ray on Mr. Dubin.  This showed:  CLINICAL DATA:  Preop ACDF  EXAM: CHEST - 2 VIEW  COMPARISON:  None.  FINDINGS: There is mild left basilar atelectasis. There is a 3 cm focal airspace opacity projecting over the thoracic spine likely within the right upper medially, but not well visualized on the frontal view. There is no focal consolidation. There is no pleural effusion or pneumothorax. The heart and mediastinal contours are unremarkable.  There is no acute osseous abnormality.  IMPRESSION: No acute cardiopulmonary disease.  3 cm focal airspace opacity projecting over the thoracic spine likely within the right upper medially, but not well visualized on the frontal view. Recommend further evaluation with a CT of the chest.   I ordered a CT chest.  This has not been done as of yet.  Patient currently lives in a shelter and the imaging facility has not been able to reach him.. Our office contacted the imaging facility again today and they will again attempt to get in contact with him to see if this can be done before his surgery next Wednesday.

## 2019-09-18 NOTE — Progress Notes (Signed)
Patient ID: Jack Fernandez, male   DOB: 1965/10/10, 54 y.o.   MRN: [id number] I connected with this patient at the [facility] and he needs refills on his gabapentin.  Note he has a surgery scheduled on January 13 for cervical spine per his orthopedist  I refilled the gabapentin as prescribed

## 2019-09-19 ENCOUNTER — Other Ambulatory Visit (HOSPITAL_COMMUNITY): Payer: Self-pay

## 2019-09-21 ENCOUNTER — Ambulatory Visit: Payer: Self-pay

## 2019-09-21 ENCOUNTER — Other Ambulatory Visit (HOSPITAL_COMMUNITY)
Admission: RE | Admit: 2019-09-21 | Discharge: 2019-09-21 | Disposition: A | Discharge status: Home / Self Care | Payer: Medicaid Other | Source: Ambulatory Visit | Attending: Orthopaedic Surgery | Admitting: Orthopaedic Surgery

## 2019-09-21 DIAGNOSIS — Z20822 Contact with and (suspected) exposure to covid-19: Secondary | ICD-10-CM | POA: Diagnosis not present

## 2019-09-21 DIAGNOSIS — Z01812 Encounter for preprocedural laboratory examination: Secondary | ICD-10-CM | POA: Insufficient documentation

## 2019-09-21 LAB — SARS CORONAVIRUS 2 (TAT 6-24 HRS): SARS Coronavirus 2: NEGATIVE

## 2019-09-21 NOTE — Telephone Encounter (Signed)
From [facility] scheduling:  09/18/19~Call #2 / LM msg on vm @ [phone]. Also s/w cousin/Latonya @ [facility] whom stated that patient is @ Shelter & that she will drive by and have patient call from her phone.MF

## 2019-09-23 ENCOUNTER — Ambulatory Visit (HOSPITAL_COMMUNITY): Payer: Medicaid Other | Admitting: Anesthesiology

## 2019-09-23 ENCOUNTER — Encounter (HOSPITAL_COMMUNITY)
Admission: RE | Disposition: A | Discharge status: Home / Self Care | Payer: Self-pay | Source: Ambulatory Visit | Attending: Orthopaedic Surgery

## 2019-09-23 ENCOUNTER — Encounter (HOSPITAL_COMMUNITY): Payer: Self-pay | Admitting: Orthopaedic Surgery

## 2019-09-23 ENCOUNTER — Ambulatory Visit (HOSPITAL_COMMUNITY): Payer: Medicaid Other | Admitting: Physician Assistant

## 2019-09-23 ENCOUNTER — Observation Stay (HOSPITAL_COMMUNITY)
Admission: RE | Admit: 2019-09-23 | Discharge: 2019-09-24 | Disposition: A | Discharge status: Home / Self Care | Payer: Medicaid Other | Source: Ambulatory Visit | Attending: Orthopaedic Surgery | Admitting: Orthopaedic Surgery

## 2019-09-23 ENCOUNTER — Ambulatory Visit (HOSPITAL_COMMUNITY): Payer: Medicaid Other

## 2019-09-23 ENCOUNTER — Other Ambulatory Visit: Payer: Self-pay

## 2019-09-23 DIAGNOSIS — G9520 Unspecified cord compression: Secondary | ICD-10-CM | POA: Insufficient documentation

## 2019-09-23 DIAGNOSIS — F319 Bipolar disorder, unspecified: Secondary | ICD-10-CM | POA: Diagnosis not present

## 2019-09-23 DIAGNOSIS — Z79899 Other long term (current) drug therapy: Secondary | ICD-10-CM | POA: Diagnosis not present

## 2019-09-23 DIAGNOSIS — F419 Anxiety disorder, unspecified: Secondary | ICD-10-CM | POA: Insufficient documentation

## 2019-09-23 DIAGNOSIS — M4802 Spinal stenosis, cervical region: Principal | ICD-10-CM | POA: Insufficient documentation

## 2019-09-23 DIAGNOSIS — K760 Fatty (change of) liver, not elsewhere classified: Secondary | ICD-10-CM | POA: Insufficient documentation

## 2019-09-23 DIAGNOSIS — I10 Essential (primary) hypertension: Secondary | ICD-10-CM | POA: Insufficient documentation

## 2019-09-23 DIAGNOSIS — F1020 Alcohol dependence, uncomplicated: Secondary | ICD-10-CM | POA: Insufficient documentation

## 2019-09-23 DIAGNOSIS — G9589 Other specified diseases of spinal cord: Secondary | ICD-10-CM | POA: Insufficient documentation

## 2019-09-23 DIAGNOSIS — M4712 Other spondylosis with myelopathy, cervical region: Secondary | ICD-10-CM | POA: Diagnosis not present

## 2019-09-23 DIAGNOSIS — K7010 Alcoholic hepatitis without ascites: Secondary | ICD-10-CM | POA: Insufficient documentation

## 2019-09-23 DIAGNOSIS — Z419 Encounter for procedure for purposes other than remedying health state, unspecified: Secondary | ICD-10-CM

## 2019-09-23 HISTORY — PX: ANTERIOR CERVICAL DECOMP/DISCECTOMY FUSION: SHX1161

## 2019-09-23 SURGERY — ANTERIOR CERVICAL DECOMPRESSION/DISCECTOMY FUSION 3 LEVELS
Anesthesia: General

## 2019-09-23 MED ORDER — PAROXETINE HCL 20 MG PO TABS: 20.0000 mg | ORAL_TABLET | Freq: Every day | ORAL | Status: DC

## 2019-09-23 MED ORDER — FENTANYL CITRATE (PF) 100 MCG/2ML IJ SOLN
25.0000 ug | INTRAMUSCULAR | Status: DC | PRN
  Administered 2019-09-23 (×2): 50 ug via INTRAVENOUS

## 2019-09-23 MED ORDER — OXYCODONE HCL 5 MG PO TABS: 5.0000 mg | ORAL_TABLET | ORAL | Status: DC | PRN

## 2019-09-23 MED ORDER — MIDAZOLAM HCL 2 MG/2ML IJ SOLN
INTRAMUSCULAR | Status: AC
  Filled 2019-09-23: qty 2

## 2019-09-23 MED ORDER — METHOCARBAMOL 500 MG PO TABS
ORAL_TABLET | ORAL | Status: AC
  Filled 2019-09-23: qty 1

## 2019-09-23 MED ORDER — PHENOL 1.4 % MT LIQD: 1.0000 | OROMUCOSAL | Status: DC | PRN

## 2019-09-23 MED ORDER — PROPOFOL 10 MG/ML IV BOLUS
INTRAVENOUS | Status: DC | PRN
  Administered 2019-09-23: 200 mg via INTRAVENOUS

## 2019-09-23 MED ORDER — LIDOCAINE 2% (20 MG/ML) 5 ML SYRINGE
INTRAMUSCULAR | Status: AC
  Filled 2019-09-23: qty 5

## 2019-09-23 MED ORDER — CEFAZOLIN SODIUM-DEXTROSE 2-4 GM/100ML-% IV SOLN
INTRAVENOUS | Status: AC
  Filled 2019-09-23: qty 100

## 2019-09-23 MED ORDER — ONDANSETRON HCL 4 MG/2ML IJ SOLN: 4.0000 mg | Freq: Four times a day (QID) | INTRAMUSCULAR | Status: DC | PRN

## 2019-09-23 MED ORDER — ONDANSETRON HCL 4 MG/2ML IJ SOLN
INTRAMUSCULAR | Status: AC
  Filled 2019-09-23: qty 2

## 2019-09-23 MED ORDER — DEXMEDETOMIDINE HCL IN NACL 200 MCG/50ML IV SOLN
INTRAVENOUS | Status: DC | PRN
  Administered 2019-09-23 (×2): 8 ug via INTRAVENOUS
  Administered 2019-09-23: 12 ug via INTRAVENOUS
  Administered 2019-09-23: 8 ug via INTRAVENOUS
  Administered 2019-09-23: 12 ug via INTRAVENOUS

## 2019-09-23 MED ORDER — ROCURONIUM BROMIDE 10 MG/ML (PF) SYRINGE
PREFILLED_SYRINGE | INTRAVENOUS | Status: AC
  Filled 2019-09-23: qty 10

## 2019-09-23 MED ORDER — CEFAZOLIN SODIUM-DEXTROSE 2-4 GM/100ML-% IV SOLN
2.0000 g | INTRAVENOUS | Status: AC
  Administered 2019-09-23: 2 g via INTRAVENOUS

## 2019-09-23 MED ORDER — PHENYLEPHRINE 40 MCG/ML (10ML) SYRINGE FOR IV PUSH (FOR BLOOD PRESSURE SUPPORT)
PREFILLED_SYRINGE | INTRAVENOUS | Status: AC
  Filled 2019-09-23: qty 10

## 2019-09-23 MED ORDER — BUPIVACAINE HCL (PF) 0.5 % IJ SOLN
INTRAMUSCULAR | Status: AC
  Filled 2019-09-23: qty 30

## 2019-09-23 MED ORDER — AMLODIPINE BESYLATE 5 MG PO TABS: 5.0000 mg | ORAL_TABLET | ORAL | Status: DC

## 2019-09-23 MED ORDER — QUETIAPINE FUMARATE 50 MG PO TABS
50.0000 mg | ORAL_TABLET | Freq: Every day | ORAL | Status: DC
  Administered 2019-09-23: 21:00:00 50 mg via ORAL
  Filled 2019-09-23 (×2): qty 1

## 2019-09-23 MED ORDER — ROCURONIUM BROMIDE 100 MG/10ML IV SOLN
INTRAVENOUS | Status: DC | PRN
  Administered 2019-09-23: 20 mg via INTRAVENOUS
  Administered 2019-09-23: 60 mg via INTRAVENOUS
  Administered 2019-09-23 (×2): 20 mg via INTRAVENOUS
  Administered 2019-09-23: 10 mg via INTRAVENOUS
  Administered 2019-09-23: 20 mg via INTRAVENOUS

## 2019-09-23 MED ORDER — FENTANYL CITRATE (PF) 100 MCG/2ML IJ SOLN
INTRAMUSCULAR | Status: AC
  Filled 2019-09-23: qty 2

## 2019-09-23 MED ORDER — OXYCODONE HCL 5 MG/5ML PO SOLN: 5.0000 mg | Freq: Once | ORAL | Status: AC | PRN

## 2019-09-23 MED ORDER — BUPIVACAINE HCL (PF) 0.5 % IJ SOLN
INTRAMUSCULAR | Status: DC | PRN
  Administered 2019-09-23: 6 mL

## 2019-09-23 MED ORDER — CHLORHEXIDINE GLUCONATE 4 % EX LIQD: 60.0000 mL | Freq: Once | CUTANEOUS | Status: DC

## 2019-09-23 MED ORDER — OXYCODONE HCL 5 MG PO TABS
ORAL_TABLET | ORAL | Status: AC
  Filled 2019-09-23: qty 1

## 2019-09-23 MED ORDER — OXYCODONE HCL 5 MG PO TABS
5.0000 mg | ORAL_TABLET | Freq: Once | ORAL | Status: AC | PRN
  Administered 2019-09-23: 5 mg via ORAL

## 2019-09-23 MED ORDER — SODIUM CHLORIDE 0.9 % IV SOLN: 250.0000 mL | INTRAVENOUS | Status: DC

## 2019-09-23 MED ORDER — PHENYLEPHRINE HCL-NACL 10-0.9 MG/250ML-% IV SOLN
INTRAVENOUS | Status: DC | PRN
  Administered 2019-09-23: 30 ug/min via INTRAVENOUS

## 2019-09-23 MED ORDER — ACETAMINOPHEN 325 MG PO TABS
650.0000 mg | ORAL_TABLET | ORAL | Status: DC | PRN
  Administered 2019-09-24: 650 mg via ORAL
  Filled 2019-09-23: qty 2

## 2019-09-23 MED ORDER — POLYETHYLENE GLYCOL 3350 17 G PO PACK: 17.0000 g | PACK | Freq: Every day | ORAL | Status: DC

## 2019-09-23 MED ORDER — SUGAMMADEX SODIUM 200 MG/2ML IV SOLN
INTRAVENOUS | Status: DC | PRN
  Administered 2019-09-23: 200 mg via INTRAVENOUS

## 2019-09-23 MED ORDER — METHOCARBAMOL 1000 MG/10ML IJ SOLN
500.0000 mg | Freq: Four times a day (QID) | INTRAVENOUS | Status: DC | PRN
  Filled 2019-09-23: qty 5

## 2019-09-23 MED ORDER — THIAMINE HCL 100 MG PO TABS: 100.0000 mg | ORAL_TABLET | Freq: Every day | ORAL | Status: DC

## 2019-09-23 MED ORDER — DEXAMETHASONE SODIUM PHOSPHATE 10 MG/ML IJ SOLN
INTRAMUSCULAR | Status: DC | PRN
  Administered 2019-09-23: 10 mg via INTRAVENOUS

## 2019-09-23 MED ORDER — OXYCODONE HCL 5 MG PO TABS
5.0000 mg | ORAL_TABLET | ORAL | Status: DC | PRN
  Administered 2019-09-23 – 2019-09-24 (×4): 5 mg via ORAL
  Filled 2019-09-23 (×4): qty 1

## 2019-09-23 MED ORDER — HEMOSTATIC AGENTS (NO CHARGE) OPTIME
TOPICAL | Status: DC | PRN
  Administered 2019-09-23: 1 via TOPICAL

## 2019-09-23 MED ORDER — SODIUM CHLORIDE 0.9% FLUSH: 3.0000 mL | Freq: Two times a day (BID) | INTRAVENOUS | Status: DC

## 2019-09-23 MED ORDER — ONDANSETRON HCL 4 MG PO TABS: 4.0000 mg | ORAL_TABLET | Freq: Four times a day (QID) | ORAL | Status: DC | PRN

## 2019-09-23 MED ORDER — LACTATED RINGERS IV SOLN: INTRAVENOUS | Status: DC

## 2019-09-23 MED ORDER — FENTANYL CITRATE (PF) 250 MCG/5ML IJ SOLN
INTRAMUSCULAR | Status: AC
  Filled 2019-09-23: qty 5

## 2019-09-23 MED ORDER — LIDOCAINE 2% (20 MG/ML) 5 ML SYRINGE
INTRAMUSCULAR | Status: DC | PRN
  Administered 2019-09-23: 60 mg via INTRAVENOUS

## 2019-09-23 MED ORDER — DEXMEDETOMIDINE HCL IN NACL 200 MCG/50ML IV SOLN
INTRAVENOUS | Status: AC
  Filled 2019-09-23: qty 50

## 2019-09-23 MED ORDER — HYDROMORPHONE HCL 1 MG/ML IJ SOLN
0.5000 mg | INTRAMUSCULAR | Status: DC | PRN
  Administered 2019-09-23: 0.5 mg via INTRAVENOUS
  Filled 2019-09-23: qty 0.5

## 2019-09-23 MED ORDER — FENTANYL CITRATE (PF) 100 MCG/2ML IJ SOLN
INTRAMUSCULAR | Status: DC | PRN
  Administered 2019-09-23: 100 ug via INTRAVENOUS
  Administered 2019-09-23: 50 ug via INTRAVENOUS

## 2019-09-23 MED ORDER — FOLIC ACID 1 MG PO TABS: 0.5000 mg | ORAL_TABLET | Freq: Every day | ORAL | Status: DC

## 2019-09-23 MED ORDER — ACETAMINOPHEN 650 MG RE SUPP: 650.0000 mg | RECTAL | Status: DC | PRN

## 2019-09-23 MED ORDER — GABAPENTIN 300 MG PO CAPS
300.0000 mg | ORAL_CAPSULE | Freq: Three times a day (TID) | ORAL | Status: DC
  Administered 2019-09-23 – 2019-09-24 (×2): 300 mg via ORAL
  Filled 2019-09-23 (×2): qty 1

## 2019-09-23 MED ORDER — FLUOXETINE HCL 20 MG PO TABS: 20.0000 mg | ORAL_TABLET | Freq: Every day | ORAL | Status: DC

## 2019-09-23 MED ORDER — ROCURONIUM BROMIDE 10 MG/ML (PF) SYRINGE
PREFILLED_SYRINGE | INTRAVENOUS | Status: AC
  Filled 2019-09-23: qty 20

## 2019-09-23 MED ORDER — MENTHOL 3 MG MT LOZG: 1.0000 | LOZENGE | OROMUCOSAL | Status: DC | PRN

## 2019-09-23 MED ORDER — MIDAZOLAM HCL 5 MG/5ML IJ SOLN
INTRAMUSCULAR | Status: DC | PRN
  Administered 2019-09-23: 2 mg via INTRAVENOUS

## 2019-09-23 MED ORDER — DOCUSATE SODIUM 100 MG PO CAPS
100.0000 mg | ORAL_CAPSULE | Freq: Two times a day (BID) | ORAL | Status: DC
  Administered 2019-09-23: 21:00:00 100 mg via ORAL
  Filled 2019-09-23 (×2): qty 1

## 2019-09-23 MED ORDER — EPINEPHRINE PF 1 MG/ML IJ SOLN
INTRAMUSCULAR | Status: AC
  Filled 2019-09-23: qty 1

## 2019-09-23 MED ORDER — ONDANSETRON HCL 4 MG/2ML IJ SOLN
INTRAMUSCULAR | Status: DC | PRN
  Administered 2019-09-23: 4 mg via INTRAVENOUS

## 2019-09-23 MED ORDER — SODIUM CHLORIDE 0.9 % IV SOLN: INTRAVENOUS | Status: DC

## 2019-09-23 MED ORDER — 0.9 % SODIUM CHLORIDE (POUR BTL) OPTIME
TOPICAL | Status: DC | PRN
  Administered 2019-09-23: 1000 mL

## 2019-09-23 MED ORDER — METHOCARBAMOL 500 MG PO TABS
500.0000 mg | ORAL_TABLET | Freq: Four times a day (QID) | ORAL | Status: DC | PRN
  Administered 2019-09-23 (×2): 500 mg via ORAL
  Filled 2019-09-23: qty 1

## 2019-09-23 MED ORDER — SODIUM CHLORIDE 0.9% FLUSH: 3.0000 mL | INTRAVENOUS | Status: DC | PRN

## 2019-09-23 MED ORDER — DEXAMETHASONE SODIUM PHOSPHATE 10 MG/ML IJ SOLN
INTRAMUSCULAR | Status: AC
  Filled 2019-09-23: qty 1

## 2019-09-23 MED ORDER — ACETAMINOPHEN 500 MG PO TABS
1000.0000 mg | ORAL_TABLET | Freq: Once | ORAL | Status: AC
  Administered 2019-09-23: 1000 mg via ORAL
  Filled 2019-09-23: qty 2

## 2019-09-23 MED ORDER — PHENYLEPHRINE 40 MCG/ML (10ML) SYRINGE FOR IV PUSH (FOR BLOOD PRESSURE SUPPORT)
PREFILLED_SYRINGE | INTRAVENOUS | Status: DC | PRN
  Administered 2019-09-23: 80 ug via INTRAVENOUS
  Administered 2019-09-23: 40 ug via INTRAVENOUS
  Administered 2019-09-23: 80 ug via INTRAVENOUS
  Administered 2019-09-23: 120 ug via INTRAVENOUS

## 2019-09-23 SURGICAL SUPPLY — 66 items
BENZOIN TINCTURE PRP APPL 2/3 (GAUZE/BANDAGES/DRESSINGS) ×3 IMPLANT
BIT DRILL SPINE QC 14 (BIT) ×3 IMPLANT
BLADE CLIPPER SURG (BLADE) IMPLANT
BONE CERV LORDOTIC 14.5X12X6 (Bone Implant) ×3 IMPLANT
BONE CERV LORDOTIC 14.5X12X7 (Bone Implant) ×3 IMPLANT
BONE CERV LORDOTIC 14.5X12X8 (Bone Implant) ×3 IMPLANT
BUR ROUND FLUTED 4 SOFT TCH (BURR) ×4 IMPLANT
BUR ROUND FLUTED 4MM SOFT TCH (BURR) ×2
CLOSURE WOUND 1/2 X4 (GAUZE/BANDAGES/DRESSINGS) ×1
COLLAR CERV LO CONTOUR FIRM DE (SOFTGOODS) IMPLANT
CORD BIPOLAR FORCEPS 12FT (ELECTRODE) ×3 IMPLANT
COVER SURGICAL LIGHT HANDLE (MISCELLANEOUS) IMPLANT
COVER WAND RF STERILE (DRAPES) ×3 IMPLANT
DRAPE C-ARM 42X72 X-RAY (DRAPES) ×3 IMPLANT
DRAPE HALF SHEET 40X57 (DRAPES) ×6 IMPLANT
DRAPE MICROSCOPE LEICA (MISCELLANEOUS) ×3 IMPLANT
DURAPREP 6ML APPLICATOR 50/CS (WOUND CARE) ×3 IMPLANT
ELECT COATED BLADE 2.86 ST (ELECTRODE) ×3 IMPLANT
ELECT REM PT RETURN 9FT ADLT (ELECTROSURGICAL) ×3
ELECTRODE REM PT RTRN 9FT ADLT (ELECTROSURGICAL) ×1 IMPLANT
EVACUATOR 1/8 PVC DRAIN (DRAIN) ×3 IMPLANT
GAUZE SPONGE 4X4 12PLY STRL (GAUZE/BANDAGES/DRESSINGS) ×3 IMPLANT
GAUZE XEROFORM 1X8 LF (GAUZE/BANDAGES/DRESSINGS) IMPLANT
GLOVE BIOGEL PI IND STRL 7.5 (GLOVE) ×2 IMPLANT
GLOVE BIOGEL PI IND STRL 8 (GLOVE) ×2 IMPLANT
GLOVE BIOGEL PI INDICATOR 7.5 (GLOVE) ×4
GLOVE BIOGEL PI INDICATOR 8 (GLOVE) ×4
GLOVE ORTHO TXT STRL SZ7.5 (GLOVE) ×6 IMPLANT
GLOVE SURG SS PI 7.5 STRL IVOR (GLOVE) ×6 IMPLANT
GLOVE SURG SS PI 8.0 STRL IVOR (GLOVE) ×6 IMPLANT
GOWN STRL REUS W/ TWL LRG LVL3 (GOWN DISPOSABLE) ×1 IMPLANT
GOWN STRL REUS W/ TWL XL LVL3 (GOWN DISPOSABLE) ×1 IMPLANT
GOWN STRL REUS W/TWL 2XL LVL3 (GOWN DISPOSABLE) ×3 IMPLANT
GOWN STRL REUS W/TWL LRG LVL3 (GOWN DISPOSABLE) ×2
GOWN STRL REUS W/TWL XL LVL3 (GOWN DISPOSABLE) ×2
HALTER HD/CHIN CERV TRACTION D (MISCELLANEOUS) ×3 IMPLANT
HEMOSTAT SURGICEL 2X14 (HEMOSTASIS) IMPLANT
KIT BASIN OR (CUSTOM PROCEDURE TRAY) ×3 IMPLANT
KIT TURNOVER KIT B (KITS) ×3 IMPLANT
MANIFOLD NEPTUNE II (INSTRUMENTS) IMPLANT
NEEDLE 25GX 5/8IN NON SAFETY (NEEDLE) ×3 IMPLANT
NS IRRIG 1000ML POUR BTL (IV SOLUTION) ×3 IMPLANT
PACK ORTHO CERVICAL (CUSTOM PROCEDURE TRAY) ×3 IMPLANT
PAD ARMBOARD 7.5X6 YLW CONV (MISCELLANEOUS) IMPLANT
PATTIES SURGICAL .5 X.5 (GAUZE/BANDAGES/DRESSINGS) IMPLANT
PATTIES SURGICAL .5 X1 (DISPOSABLE) ×3 IMPLANT
PIN TEMP FIXATION KIRSCHNER (EXFIX) ×6 IMPLANT
PLATE ANT CERV XTEND 3 LV 51 (Plate) ×3 IMPLANT
POSITIONER HEAD DONUT 9IN (MISCELLANEOUS) IMPLANT
SCREW XTD VAR 4.2 SELF TAP (Screw) ×24 IMPLANT
SPONGE INTESTINAL PEANUT (DISPOSABLE) ×6 IMPLANT
STRIP CLOSURE SKIN 1/2X4 (GAUZE/BANDAGES/DRESSINGS) ×2 IMPLANT
SURGIFLO W/THROMBIN 8M KIT (HEMOSTASIS) ×3 IMPLANT
SUT BONE WAX W31G (SUTURE) ×3 IMPLANT
SUT VIC AB 2-0 CT1 27 (SUTURE) ×4
SUT VIC AB 2-0 CT1 TAPERPNT 27 (SUTURE) ×2 IMPLANT
SUT VIC AB 3-0 X1 27 (SUTURE) ×3 IMPLANT
SUT VICRYL 4-0 PS2 18IN ABS (SUTURE) ×3 IMPLANT
SYR 30ML SLIP (SYRINGE) ×3 IMPLANT
SYR BULB 3OZ (MISCELLANEOUS) ×3 IMPLANT
TAPE CLOTH SURG 4X10 WHT LF (GAUZE/BANDAGES/DRESSINGS) ×3 IMPLANT
TOWEL GREEN STERILE (TOWEL DISPOSABLE) ×3 IMPLANT
TOWEL GREEN STERILE FF (TOWEL DISPOSABLE) ×3 IMPLANT
TRAY FOLEY W/BAG SLVR 16FR (SET/KITS/TRAYS/PACK) ×2
TRAY FOLEY W/BAG SLVR 16FR ST (SET/KITS/TRAYS/PACK) ×1 IMPLANT
WATER STERILE IRR 1000ML POUR (IV SOLUTION) ×3 IMPLANT
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
## 2019-09-23 NOTE — Progress Notes (Signed)
Orthopedic Tech Progress Note Patient Details:  Jack Fernandez 21-Jul-1966 [id number]  Ortho Devices Type of Ortho Device: Soft collar Ortho Device/Splint Location: neck Ortho Device/Splint Interventions: Application, Ordered   Post Interventions Patient Tolerated: Well Instructions Provided: Care of device, Adjustment of device   Donald Pore 09/23/2019, 6:56 PM

## 2019-09-23 NOTE — Op Note (Signed)
Preop diagnosis: Cervical myelomalacia with myelopathy and compression C3-C5.  Postoperative diagnosis: Same  Procedure: 3 level cervical fusion, allograft x3 and plate.                  C3-4, C4-5, and C5-6. Surgeon: Rodell Perna, MD  Assistant: Benjiman Core, PA-C medically necessary and present for the entire procedure  Anesthesia General tracheal +6 cc Marcaine local.  Implants: [employer]  1m Xtend plate, 4.2 x 14 mm self-tapping variable screws x8.  8 mm cortical cancellous  VG-2n graft at C3-4, 6 mm at C4-5, and 7 mm graft at C5-6 level.  Drains: 1 Hemovac neck.  Original surgical plan was for corpectomy at C4.  Patient had severe spondylosis and was partial ankylosis and it was difficult to even identify the disc spaces since there were mounds right and left anteriorly and and these did not occur actually at the level of the endplates.  Multiple x-rays were taken just to identify properly the midline to start with and since patient had cord compression with myelomalacia primarily at the C3-4 level 3 level cervical fusion was performed putting an extra-large graft at C3-4 biasing inferiorly to catch the area of disc that was causing cord compression.  3 level cervical fusion with plate was performed.  Grafts were placed as above.  After induction general anesthesia orotracheal ovation had ultra traction was applied without weight arms were tucked at the side with wrist restraints were pulled down under fluoroscopic pictures during the procedure when needed.  DuraPrep preoperative Ancef prophylaxis area squared with towel sterile skin marker Betadine Steri-Drape sterile male standard the head thyroid sheets and drapes and timeout procedure was completed Ancef was given 2 g prophylactically.  Incision was made in a prominent fold starting at the midline extending to the left based on palpable landmarks despite this over the C4 level and a C4-5 disc space.  We entered above the omohyoid and  prominent thyroid artery was coagulated which was transverse.  Exposure down to the l[employer]which were hypertrophic and there were spurs right and left that did not occur exactly at the disc base and palpation of the needle was performed using C-arm visualization in order to identify the C3-4 level.  Once this was opened up with bur there was minimal bill ability at this disc base and it was elected to instead of performing a corpectomy do a larger allograft taking more the top portion of C4 as well as the inferior portion of C3.  Operative microscope was used and checked the disc hypertrophic ligament was used to remove and decompress the dura.  Once dura was decompressed trial sizers for 6 7 then a millimeter graft gave a nice fit  Was MAC marked at the midline countersunk 2 mm and then identical procedure repeated at C4-5.  There was less so severe compression disc protrusion at C4-5.  Nerve root on the right side was visualized.  There was intact and decompression was performed taking thick chunks of ligament.  With the irregularity of the disc spaces final level was performed in a 7 mm graft was placed at this level.  C arm was used for visualization and grafts were biased over to the right side but remained in the vertebral body.  The  Xtend globus plate was selected 51 mm sub with spikes checked under C arm initially screws were placed and then checking it appeared that it was oblique and was biased to far over to the  right side and all screws removed except for the top left screw that was in C3 and the plate was swung at the inferior aspect over the left after using rondure to remove bone spurs anteriorly so that the plate would sit down flat.  Repeat spikes were used to hold the plate checked under C arm and then holes were filled taking final pictures showing good position AP and lateral of the plate.  Plate was close to midline all screws were in the vertebral body grafts are in good position.   After irrigation with saline solution inspection for hemostasis tightening the tiny locking screws down to locking screw in place Hemovac was placed in and out technique in line with the skin incision.  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  Patient tolerated procedure well was transferred recovery room in stable condition.

## 2019-09-23 NOTE — Anesthesia Procedure Notes (Signed)
Procedure Name: Intubation Date/Time: 09/23/2019 12:40 PM Performed by: Jed Limerick, CRNA Pre-anesthesia Checklist: Patient identified, Emergency Drugs available, Suction available and Patient being monitored Patient Re-evaluated:Patient Re-evaluated prior to induction Oxygen Delivery Method: Circle System Utilized Preoxygenation: Pre-oxygenation with 100% oxygen Induction Type: IV induction Ventilation: Mask ventilation without difficulty Laryngoscope Size: Glidescope and 3 Grade View: Grade I Tube type: Oral Tube size: 7.5 mm Number of attempts: 1 Airway Equipment and Method: Stylet and Oral airway Placement Confirmation: ETT inserted through vocal cords under direct vision,  positive ETCO2 and breath sounds checked- equal and bilateral Secured at: 22 cm Tube secured with: Tape Dental Injury: Teeth and Oropharynx as per pre-operative assessment

## 2019-09-23 NOTE — Progress Notes (Signed)
Orthopedic Tech Progress Note Patient Details:  [name] [birth year] [id number] OR RN called requesting an ASPEN COLLAR. I let him know that materials has collars but today they're out and has to order them. So I called in a STAT order to [facility] for an ASPEN COLLAR. Patient ID: Jack Fernandez, male   DOB: 29-Jan-1966, 54 y.o.   MRN: [id number]   Donald Pore 09/23/2019, 12:34 PM

## 2019-09-23 NOTE — Transfer of Care (Signed)
Immediate Anesthesia Transfer of Care Note  Patient: Jack Fernandez  Procedure(s) Performed: CERVICAL THREE-FOUR,CERVICAL FOUR-FIVE  , CERVICAL FIVE-SIX  ANTERIOR CERVICAL DECOMPRESSION/DISCECTOMY FUSION, ALLOGRAFT, PLATES, FUSION CERVICAL THREE-CERVICAL SIX (N/A )  Patient Location: PACU  Anesthesia Type:General  Level of Consciousness: awake and alert   Airway & Oxygen Therapy: Patient Spontanous Breathing and Patient connected to nasal cannula oxygen  Post-op Assessment: Report given to RN, Post -op Vital signs reviewed and stable and Patient moving all extremities X 4  Post vital signs: Reviewed and stable  Last Vitals:  Vitals Value Taken Time  BP 127/85 09/23/19 1653  Temp    Pulse 93 09/23/19 1702  Resp 20 09/23/19 1702  SpO2 96 % 09/23/19 1702  Vitals shown include unvalidated device data.  Last Pain:  Vitals:   09/23/19 1110  TempSrc:   PainSc: 0-No pain      Patients Stated Pain Goal: 3 (09/23/19 1110)  Complications: No apparent anesthesia complications

## 2019-09-23 NOTE — Interval H&P Note (Signed)
History and Physical Interval Note:  09/23/2019 12:26 PM  Jack Fernandez  has presented today for surgery, with the diagnosis of cervical stenosis, myeolopathy.  The various methods of treatment have been discussed with the patient and family. After consideration of risks, benefits and other options for treatment, the patient has consented to  Procedure(s): C4 CORPECTOMY CAGE, C5-6 ANTERIOR CERVICAL DECOMPRESSION/DISCECTOMY FUSION, ALLOGRAFT, PLATES, FUSION Y8-M5 (N/A) as a surgical intervention.  The patient's history has been reviewed, patient examined, no change in status, stable for surgery.  I have reviewed the patient's chart and labs.  Questions were answered to the patient's satisfaction.     Eldred Manges

## 2019-09-24 ENCOUNTER — Encounter: Payer: Self-pay | Admitting: *Deleted

## 2019-09-24 ENCOUNTER — Observation Stay (HOSPITAL_COMMUNITY): Payer: Medicaid Other

## 2019-09-24 ENCOUNTER — Other Ambulatory Visit: Payer: Self-pay | Admitting: Critical Care Medicine

## 2019-09-24 DIAGNOSIS — Z5900 Homelessness unspecified: Secondary | ICD-10-CM | POA: Insufficient documentation

## 2019-09-24 DIAGNOSIS — M4802 Spinal stenosis, cervical region: Secondary | ICD-10-CM

## 2019-09-24 MED ORDER — OXYCODONE-ACETAMINOPHEN 5-325 MG PO TABS: 1.0000 | ORAL_TABLET | Freq: Four times a day (QID) | ORAL | 0 refills | Status: DC | PRN

## 2019-09-24 MED FILL — OXYCODONE-ACETAMINOPHEN 5-3: 5-325 | 5 days supply | Qty: 20 | Fill #0

## 2019-09-24 NOTE — Congregational Nurse Program (Signed)
Met Mr. Jack Fernandez at [facility] this PM for post op visit. Bandage surgical site dry and intact. Client wearing neck collar able to adjust for comfort. At this time offers no c/o pain. CN arranged for Mr. Jack Fernandez to utilize elevator.Reviewed medication regimen for pain management. Made Dr. Joya Gaskins aware of Mr. Jack Fernandez vital signs. No orders given. Will f/u tomorrow with Mr. Jack Fernandez.

## 2019-09-24 NOTE — Progress Notes (Signed)
I saw this patient this morning on rounds and he is doing extremely well.  I very much appreciate Dr. Marlene Bast excellent care.  The plan is for the patient to return to the shelter and he will have accommodations there so that he can recover safely.  Since our clinic pharmacy does not stock narcotic medications I took the liberty of E prescribing the Percocet to the [facility] outpatient pharmacy where our Congregational nurse will pick this up and deliver it to the patient.  I went into the E HR and reconciled the medication so that the AVS could be printed   I will follow this patient up at the shelter and he is also my primary care patient in [facility] I will follow him up there as well  Once again I very much appreciate all the great care from  Dr. Ophelia Charter and his team as well as the entire staff on the spine unit.   Luisa Hart WrightMD

## 2019-09-24 NOTE — Progress Notes (Signed)
Physical Therapy Treatment Patient Details Name: Jack Fernandez MRN: [id number] DOB: 1966/08/25 Today's Date: 09/24/2019    History of Present Illness Pt is a 54 year old man with hx of HTN, bipolar disorder, substance abuse, and alcoholic cirrhosis who was admitted on 09/23/19 for C3-4, 4-5, 5-6 ACDF. Pt also reports he has a hx of stroke with residual L side weakness.     PT Comments    Focus of session was to trial adaptive equipment for L hand on walker. In morning session, noted pt with difficulty gripping walker on the L and maintaining a neutral wrist during ambulation. Trialed the hand splint for the walker and pt did not like the feel of it, requested to try without it. Noted pt maintained good grip on the walker on the L side and was also improved with maintaining a neutral wrist alignment during gait training as well. Will continue to follow and progress as able per POC.     Follow Up Recommendations  No PT follow up;Supervision for mobility/OOB     Equipment Recommendations  Rolling walker with 5" wheels    Recommendations for Other Services       Precautions / Restrictions Precautions Precautions: Fall;Cervical Precaution Booklet Issued: Yes (comment) Precaution Comments: Pt was cued for precautions during functional mobility.  Required Braces or Orthoses: Cervical Brace Cervical Brace: Hard collar Restrictions Weight Bearing Restrictions: No    Mobility  Bed Mobility               General bed mobility comments: Pt sitting up in recliner upon PT arrival.   Transfers Overall transfer level: Modified independent Equipment used: None             General transfer comment: No assist to power-up to full stand. No overt LOB noted.   Ambulation/Gait Ambulation/Gait assistance: Supervision Gait Distance (Feet): 200 Feet Assistive device: Rolling walker (2 wheeled) Gait Pattern/deviations: Step-through pattern;Decreased stride length;Decreased weight shift  to left;Decreased dorsiflexion - left Gait velocity: Decreased Gait velocity interpretation: <1.8 ft/sec, indicate of risk for recurrent falls General Gait Details: Moving with increased gait speed this afternoon with increased floor clearance on the L, and increased grip on the L walker grip.   Stairs             Wheelchair Mobility    Modified Rankin (Stroke Patients Only)       Balance Overall balance assessment: Needs assistance Sitting-balance support: Feet supported;No upper extremity supported Sitting balance-Leahy Scale: Fair     Standing balance support: No upper extremity supported;During functional activity Standing balance-Leahy Scale: Poor Standing balance comment: Reliant on UE support                            Cognition Arousal/Alertness: Awake/alert Behavior During Therapy: WFL for tasks assessed/performed Overall Cognitive Status: Within Functional Limits for tasks assessed                                        Exercises      General Comments        Pertinent Vitals/Pain Pain Assessment: Faces Faces Pain Scale: Hurts a little bit Pain Location: incision, L shoulder Pain Descriptors / Indicators: Sore Pain Intervention(s): Monitored during session    Home Living Family/patient expects to be discharged to:: Shelter/Homeless Living Arrangements: Other (Comment)(shelter)     Home Access:  Elevator     Home Equipment: [place] - single point;Shower seat;Grab bars - toilet;Grab bars - tub/shower;Hand held shower head Additional Comments: Has been living at [facility] x 2 months.    Prior Function Level of Independence: Independent with assistive device(s)      Comments: walks with a cane, reports falls   PT Goals (current goals can now be found in the care plan section) Acute Rehab PT Goals Patient Stated Goal: to continue getting stronger PT Goal Formulation: With patient Time For Goal Achievement:  10/01/19 Potential to Achieve Goals: Good Progress towards PT goals: Progressing toward goals    Frequency    Min 5X/week      PT Plan Current plan remains appropriate    Co-evaluation              AM-PAC PT "6 Clicks" Mobility   Outcome Measure  Help needed turning from your back to your side while in a flat bed without using bedrails?: None Help needed moving from lying on your back to sitting on the side of a flat bed without using bedrails?: None Help needed moving to and from a bed to a chair (including a wheelchair)?: None Help needed standing up from a chair using your arms (e.g., wheelchair or bedside chair)?: None Help needed to walk in hospital room?: None Help needed climbing 3-5 steps with a railing? : A Little 6 Click Score: 23    End of Session Equipment Utilized During Treatment: Gait belt;Cervical collar Activity Tolerance: Patient tolerated treatment well Patient left: in chair;with call bell/phone within reach Nurse Communication: Mobility status PT Visit Diagnosis: Unsteadiness on feet (R26.81);Pain;Other symptoms and signs involving the nervous system (R29.898) Pain - part of body: (incision site/neck)     Time: 3875-6433 PT Time Calculation (min) (ACUTE ONLY): 15 min  Charges:  $Gait Training: 8-22 mins                     Jack Fernandez, PT, DPT Acute Rehabilitation Services Pager: [phone] Office: [phone]    Jack Fernandez 09/24/2019, 2:43 PM

## 2019-09-24 NOTE — Progress Notes (Signed)
CSW provided taxi voucher, as patient requires transportation back to [facility].   Osborne Casco Nikki Glanzer LCSW [phone]

## 2019-09-24 NOTE — Discharge Instructions (Signed)
Keep collar on at all times. You may use the extra soft collar wrapped with saran wrap when you shower. After you dry off put the hard collar back on and keep it snug.  See Dr. Ophelia Charter in 2 wks in the office.

## 2019-09-24 NOTE — Evaluation (Signed)
Occupational Therapy Evaluation Patient Details Name: Jack Fernandez MRN: [id number] DOB: 1966-02-19 Today's Date: 09/24/2019    History of Present Illness Pt is a 54 year old man with hx of HTN, bipolar disorder, substance abuse, and alcoholic cirrhosis who was admitted on 09/23/19 for C3-4, 4-5, 5-6 ACDF. Pt also reports he has a hx of stroke with residual L side weakness.    Clinical Impression   Pt was functioning independently with a cane prior to admission. He reports falls to nursing. Pt presents with B UE weakness and impaired sensation greater on L than R with impaired coordination and unsteady gait, but reports he is ambulating at his baseline. Pt educated in cervical precautions, compensatory strategies and fall prevention. He verbalized and/or demonstrated understanding. Reinforced with written handout. Plan is for pt to return to [facility] upon discharge.     Follow Up Recommendations  No OT follow up    Equipment Recommendations  None recommended by OT    Recommendations for Other Services       Precautions / Restrictions Precautions Precautions: Fall;Cervical Precaution Booklet Issued: Yes (comment) Precaution Comments: educated in cervical precautions, fall prevention  Restrictions Weight Bearing Restrictions: No      Mobility Bed Mobility Overal bed mobility: Modified Independent             General bed mobility comments: instructed in log roll technique  Transfers Overall transfer level: Modified independent Equipment used: Straight cane                  Balance                                           ADL either performed or assessed with clinical judgement   ADL Overall ADL's : Needs assistance/impaired Eating/Feeding: Independent;Sitting   Grooming: Oral care;Wash/dry hands;Standing;Supervision/safety Grooming Details (indicate cue type and reason): instructed in 2 cup method for oral care and use of face  cloth Upper Body Bathing: Set up;Sitting   Lower Body Bathing: Sit to/from stand;Modified independent   Upper Body Dressing : Sitting;Set up   Lower Body Dressing: Sit to/from stand;Modified independent   Toilet Transfer: Supervision/safety;Ambulation(cane)   Toileting- Clothing Manipulation and Hygiene: Sit to/from stand;Modified independent       Functional mobility during ADLs: Supervision/safety;Cane       Vision Baseline Vision/History: Wears glasses Wears Glasses: Reading only Patient Visual Report: No change from baseline       Perception     Praxis      Pertinent Vitals/Pain Pain Assessment: Faces Faces Pain Scale: Hurts a little bit Pain Location: incision, L shoulder Pain Descriptors / Indicators: Sore Pain Intervention(s): Monitored during session     Hand Dominance Right   Extremity/Trunk Assessment Upper Extremity Assessment Upper Extremity Assessment: RUE deficits/detail;LUE deficits/detail RUE Sensation: decreased proprioception RUE Coordination: decreased fine motor;decreased gross motor LUE Deficits / Details: reports longstanding weakness compared to R, postures in mild flexion with ambulation LUE Sensation: decreased proprioception LUE Coordination: decreased fine motor;decreased gross motor   Lower Extremity Assessment Lower Extremity Assessment: Defer to PT evaluation   Cervical / Trunk Assessment Cervical / Trunk Assessment: Other exceptions Cervical / Trunk Exceptions: s/p cervical surgery   Communication Communication Communication: No difficulties   Cognition Arousal/Alertness: Awake/alert Behavior During Therapy: WFL for tasks assessed/performed Overall Cognitive Status: Within Functional Limits for tasks assessed  General Comments       Exercises     Shoulder Instructions      Home Living Family/patient expects to be discharged to:: Shelter/Homeless Living  Arrangements: Other (Comment)(shelter)   Type of Home: Other(Comment) Home Access: Elevator           Bathroom Shower/Tub: Walk-in shower   Bathroom Toilet: Handicapped height     Home Equipment: [place] - single point;Shower seat;Grab bars - toilet;Grab bars - tub/shower;Hand held shower head   Additional Comments: Has been living at [facility] x 2 months.      Prior Functioning/Environment Level of Independence: Independent with assistive device(s)        Comments: walks with a cane, reports falls        OT Problem List:        OT Treatment/Interventions:      OT Goals(Current goals can be found in the care plan section) Acute Rehab OT Goals Patient Stated Goal: to continue getting stronger  OT Frequency:     Barriers to D/C:            Co-evaluation              AM-PAC OT "6 Clicks" Daily Activity     Outcome Measure Help from another person eating meals?: None Help from another person taking care of personal grooming?: None Help from another person toileting, which includes using toliet, bedpan, or urinal?: None Help from another person bathing (including washing, rinsing, drying)?: None Help from another person to put on and taking off regular upper body clothing?: None Help from another person to put on and taking off regular lower body clothing?: None 6 Click Score: 24   End of Session Equipment Utilized During Treatment: Gait belt;Other (comment)(cane)  Activity Tolerance: Patient tolerated treatment well Patient left: in chair;with call bell/phone within reach  OT Visit Diagnosis: Other abnormalities of gait and mobility (R26.89);Muscle weakness (generalized) (M62.81)                Time: 7035-0093 OT Time Calculation (min): 26 min Charges:  OT General Charges $OT Visit: 1 Visit OT Evaluation $OT Eval Moderate Complexity: 1 Mod OT Treatments $Self Care/Home Management : 8-22 mins  Nestor Lewandowsky, OTR/L Acute Rehabilitation  Services Pager: [phone] Office: [phone] Malka So 09/24/2019, 9:08 AM

## 2019-09-24 NOTE — Progress Notes (Signed)
Pt given D/C instructions with Rx, verbal understanding was provided. Pt's incision is clean and dry with no sign of infection. Pt's IV and Hemovac were removed prior to D/C. Pt D/C'd home via wheelchair per MD order. A cab was called for Pt and voucher was given by TOC. Pt D/C'd to [facility] per MD order. Rema Fendt, RN

## 2019-09-24 NOTE — Progress Notes (Signed)
   Subjective: 1 Day Post-Op Procedure(s) (LRB): CERVICAL THREE-FOUR,CERVICAL FOUR-FIVE  , CERVICAL FIVE-SIX  ANTERIOR CERVICAL DECOMPRESSION/DISCECTOMY FUSION, ALLOGRAFT, PLATES, FUSION CERVICAL THREE-CERVICAL SIX (N/A) Patient reports pain as mild.  " my walking is not any different than before surgery"  Objective: Vital signs in last 24 hours: Temp:  [97.9 F (36.6 C)-98.8 F (37.1 C)] 97.9 F (36.6 C) (01/14 0448) Pulse Rate:  [75-109] 86 (01/14 0448) Resp:  [14-26] 18 (01/14 0448) BP: (122-159)/(84-112) 159/112 (01/14 0448) SpO2:  [96 %-99 %] 99 % (01/14 0448) Weight:  [93.2 kg] 93.2 kg (01/13 1049)  Intake/Output from previous day: 01/13 0701 - 01/14 0700 In: 1600 [P.O.:600; I.V.:1000] Out: 1385 [Urine:1125; Drains:110; Blood:150] Intake/Output this shift: No intake/output data recorded.  No results for input(s): HGB in the last 72 hours. No results for input(s): WBC, RBC, HCT, PLT in the last 72 hours. No results for input(s): NA, K, CL, CO2, BUN, CREATININE, GLUCOSE, CALCIUM in the last 72 hours. No results for input(s): LABPT, INR in the last 72 hours.  30cc HV last shift . HV removed. dressing changed. still with left greater than right weakness. Still with left LE spasticity .  DG Cervical Spine 2-3 Views  Result Date: 09/23/2019 CLINICAL DATA:  C4 corpectomy with C3-C6 ACDF EXAM: CERVICAL SPINE - 2-3 VIEW COMPARISON:  MR cervical spine 08/24/2019 FLUOROSCOPY TIME:  0 minutes 35 seconds Images submitted: 3 FINDINGS: Osseous demineralization. AP and lateral views of the cervical spine intraoperatively demonstrate osseous demineralization. Anterior plate and screws identified at C3-C6. No fracture, subluxation, or bone destruction. IMPRESSION: Anterior fusion C3-C6. Electronically Signed   By: Ulyses Southward M.D.   On: 09/23/2019 16:40   DG C-Arm 1-60 Min  Result Date: 09/23/2019 CLINICAL DATA:  54 year old male with C4 corpectomy and C3-C6 ACDF. EXAM: DG C-ARM 1-60 MIN  COMPARISON:  Cervical spine CT dated 08/11/2018. FINDINGS: Three fluoroscopic intraoperative spot images provided. The total fluoroscopic time is 35 seconds. C3-C6 ACDF.  Partially visualized endotracheal tube. IMPRESSION: Intraop for Skopic images of C3-C6 ACDF. Electronically Signed   By: Elgie Collard M.D.   On: 09/23/2019 16:49    Assessment/Plan: 1 Day Post-Op Procedure(s) (LRB): CERVICAL THREE-FOUR,CERVICAL FOUR-FIVE  , CERVICAL FIVE-SIX  ANTERIOR CERVICAL DECOMPRESSION/DISCECTOMY FUSION, ALLOGRAFT, PLATES, FUSION CERVICAL THREE-CERVICAL SIX (N/A) Plan:   Discharge home with landlady. Dr. Delford Field to see this AM.  Dan Humphreys ordered that he can use. [place] collar on at all times , office 2 wks.   Eldred Manges 09/24/2019, 7:25 AM

## 2019-09-24 NOTE — Evaluation (Signed)
Physical Therapy Evaluation Patient Details Name: Jack Fernandez MRN: [id number] DOB: [birth year] Today's Date: 09/24/2019   History of Present Illness  Pt is a 54 year old man with hx of HTN, bipolar disorder, substance abuse, and alcoholic cirrhosis who was admitted on 09/23/19 for C3-4, 4-5, 5-6 ACDF. Pt also reports he has a hx of stroke with residual L side weakness.   Clinical Impression  Pt admitted with above diagnosis. At the time of PT eval, pt was able to demonstrate transfers and ambulation with gross min guard assist to min assist and SPC/RW for support. Reports he is at baseline of function with mobility, however he also endorses falls in the past. Recommend RW for added stability and safety and to decrease risk for future falls. Pt was educated on precautions, brace application/wearing schedule, appropriate activity progression, and car transfer. Pt currently with functional limitations due to the deficits listed below (see PT Problem List). Pt will benefit from skilled PT to increase their independence and safety with mobility to allow discharge to the venue listed below.      Follow Up Recommendations No PT follow up;Supervision for mobility/OOB    Equipment Recommendations  Rolling walker with 5" wheels    Recommendations for Other Services       Precautions / Restrictions Precautions Precautions: Fall;Cervical Precaution Booklet Issued: Yes (comment) Precaution Comments: educated in cervical precautions, fall prevention. Pt was cued for precautions during functional mobility.  Required Braces or Orthoses: Cervical Brace Cervical Brace: Hard collar Restrictions Weight Bearing Restrictions: No      Mobility  Bed Mobility               General bed mobility comments: Pt sitting up in recliner upon PT arrival. Verbally reviewed log roll technique.   Transfers Overall transfer level: Modified independent Equipment used: Straight cane;Rolling walker (2 wheeled)             General transfer comment: Pt demonstrated proper hand placement on seated surface for safety. No assist required but increased time taken to achieve full stand.   Ambulation/Gait Ambulation/Gait assistance: Min guard;Min assist Gait Distance (Feet): 250 Feet Assistive device: Straight cane;Rolling walker (2 wheeled) Gait Pattern/deviations: Step-through pattern;Decreased stride length;Decreased weight shift to left;Decreased dorsiflexion - left Gait velocity: Decreased Gait velocity interpretation: <1.8 ft/sec, indicate of risk for recurrent falls General Gait Details: VC's for improved posture and general safety. Pt Initially with SPC and required occasional min assist for balance support. With RW, only provided min guard assist for safety. No overt LOB noted, and pt reports feeling more comfortable with 2 hand support.   Stairs            Wheelchair Mobility    Modified Rankin (Stroke Patients Only)       Balance Overall balance assessment: Needs assistance Sitting-balance support: Feet supported;No upper extremity supported Sitting balance-Leahy Scale: Fair     Standing balance support: No upper extremity supported;During functional activity Standing balance-Leahy Scale: Poor Standing balance comment: Reliant on UE support                             Pertinent Vitals/Pain Pain Assessment: Faces Faces Pain Scale: Hurts a little bit Pain Location: incision, L shoulder Pain Descriptors / Indicators: Sore Pain Intervention(s): Limited activity within patient's tolerance;Monitored during session;Repositioned    Home Living Family/patient expects to be discharged to:: Shelter/Homeless Living Arrangements: Other (Comment)(shelter)     Home Access: Elevator  Home Equipment: [place]ud[place] - single point;Shower seat;Grab bars - toilet;Grab bars - tub/shower;Hand held shower head Additional Comments: Has been living at [facility] x 2  months.    Prior Function Level of Independence: Independent with assistive device(s)         Comments: walks with a cane, reports falls     Hand Dominance   Dominant Hand: Right    Extremity/Trunk Assessment   Upper Extremity Assessment Upper Extremity Assessment: Defer to OT evaluation;LUE deficits/detail    Lower Extremity Assessment Lower Extremity Assessment: LLE deficits/detail LLE Deficits / Details: Decreased DF noted, and general weakness.    Cervical / Trunk Assessment Cervical / Trunk Assessment: Other exceptions Cervical / Trunk Exceptions: s/p cervical surgery  Communication   Communication: No difficulties  Cognition Arousal/Alertness: Awake/alert Behavior During Therapy: WFL for tasks assessed/performed Overall Cognitive Status: Within Functional Limits for tasks assessed                                        General Comments      Exercises     Assessment/Plan    PT Assessment Patient needs continued PT services  PT Problem List Decreased strength;Decreased activity tolerance;Decreased balance;Decreased mobility;Decreased knowledge of use of DME;Decreased knowledge of precautions;Decreased safety awareness;Pain       PT Treatment Interventions DME instruction;Gait training;Functional mobility training;Therapeutic activities;Therapeutic exercise;Neuromuscular re-education;Patient/family education    PT Goals (Current goals can be found in the Care Plan section)  Acute Rehab PT Goals Patient Stated Goal: to continue getting stronger PT Goal Formulation: With patient Time For Goal Achievement: 10/01/19 Potential to Achieve Goals: Good    Frequency Min 5X/week   Barriers to discharge        Co-evaluation               AM-PAC PT "6 Clicks" Mobility  Outcome Measure Help needed turning from your back to your side while in a flat bed without using bedrails?: None Help needed moving from lying on your back to sitting  on the side of a flat bed without using bedrails?: A Little Help needed moving to and from a bed to a chair (including a wheelchair)?: A Little Help needed standing up from a chair using your arms (e.g., wheelchair or bedside chair)?: A Little Help needed to walk in hospital room?: A Little Help needed climbing 3-5 steps with a railing? : A Little 6 Click Score: 19    End of Session Equipment Utilized During Treatment: Gait belt;Cervical collar Activity Tolerance: Patient tolerated treatment well Patient left: in chair;with call bell/phone within reach Nurse Communication: Mobility status PT Visit Diagnosis: Unsteadiness on feet (R26.81);Pain;Other symptoms and signs involving the nervous system (R29.898) Pain - part of body: (incision site/neck)    Time: 4709-6283 PT Time Calculation (min) (ACUTE ONLY): 20 min   Charges:   PT Evaluation $PT Eval Moderate Complexity: 1 Mod          Rolinda Roan, PT, DPT Acute Rehabilitation Services Pager: [phone] Office: [phone]   Thelma Comp 09/24/2019, 2:18 PM

## 2019-09-24 NOTE — Anesthesia Postprocedure Evaluation (Signed)
Anesthesia Post Note  Patient: Jack Fernandez  Procedure(s) Performed: CERVICAL THREE-FOUR,CERVICAL FOUR-FIVE  , CERVICAL FIVE-SIX  ANTERIOR CERVICAL DECOMPRESSION/DISCECTOMY FUSION, ALLOGRAFT, PLATES, FUSION CERVICAL THREE-CERVICAL SIX (N/A )     Patient location during evaluation: PACU Anesthesia Type: General Level of consciousness: awake and alert Pain management: pain level controlled Vital Signs Assessment: post-procedure vital signs reviewed and stable Respiratory status: spontaneous breathing, nonlabored ventilation, respiratory function stable and patient connected to nasal cannula oxygen Cardiovascular status: blood pressure returned to baseline and stable Postop Assessment: no apparent nausea or vomiting Anesthetic complications: no    Last Vitals:  Vitals:   09/24/19 0448 09/24/19 0725  BP: (!) 159/112 140/90  Pulse: 86 (!) 110  Resp: 18 16  Temp: 36.6 C (!) 36.4 C  SpO2: 99% 99%    Last Pain:  Vitals:   09/24/19 1000  TempSrc:   PainSc: 6                  Tyiesha Brackney S

## 2019-09-24 NOTE — Progress Notes (Signed)
ePrescribed percocet to [facility] outpt pharmacy for Congregational nurse fund and CN nurse to pick up and deliver to homeless shelter

## 2019-09-30 ENCOUNTER — Other Ambulatory Visit: Payer: Self-pay | Admitting: Critical Care Medicine

## 2019-09-30 ENCOUNTER — Encounter: Payer: Self-pay | Admitting: Critical Care Medicine

## 2019-09-30 MED ORDER — OXYCODONE-ACETAMINOPHEN 5-325 MG PO TABS: 1.0000 | ORAL_TABLET | Freq: Four times a day (QID) | ORAL | 0 refills | Status: DC | PRN

## 2019-09-30 MED ORDER — AMLODIPINE BESYLATE 5 MG PO TABS: 5.0000 mg | ORAL_TABLET | Freq: Every day | ORAL | 3 refills | Status: DC

## 2019-09-30 MED FILL — OXYCODONE-ACETAMINOPHEN 5-3: 5-325 | 5 days supply | Qty: 20 | Fill #0

## 2019-09-30 MED FILL — AMLODIPINE BESYLATE 5 MG TA: 5 | 30 days supply | Qty: 30 | Fill #0

## 2019-09-30 NOTE — Progress Notes (Signed)
Med refills

## 2019-09-30 NOTE — Congregational Nurse Program (Signed)
    Jack Fernandez seen this PM for post-op eval. Corrected and advised against his using a cane vs. walker Dr.Wright examined surgical site and neck brace .Advised about food choices.

## 2019-10-01 ENCOUNTER — Telehealth: Payer: Self-pay | Admitting: Critical Care Medicine

## 2019-10-01 NOTE — Progress Notes (Signed)
Patient ID: Jack Fernandez, male   DOB: 29-Aug-1966, 54 y.o.   MRN: [id number] This patient is seen today in the [facility] and is 1 week postop from anterior cervical spine surgery the patient states his pain in his left arm is improved but has returned to some degree he has increased discomfort in the neck and some pain in his left leg.  Unfortunately he has been using his cane only and has been ambulating quite a bit.  He is out of his Percocet.  He has a postop visit on January 22.  On exam blood pressure 126/84 temp 97 saturation 100% pulse 106 chest was clear without evidence of wheeze or rhonchi cardiac exam showed a regular rate rhythm normal S1-S2 abdomen soft nontender extremities show no edema.  Neurologic exam is essentially unchanged from prior exams  The surgical incision in the left anterior neck is healing and there is no drainage.  The patient is wearing a neck brace  Impression is that the patient is status post 1 week cervical spine surgery and is likely overdoing this and pushing himself too hard  Plan will be for the patient received a one-time refill of the Percocet  I did check the [facility] database there are no other prescriptions except for the prescription given 1 week ago  Also the patient will use his walker more and wait for his postoperative check before he pushes himself further with exercise he may yet need physical therapy

## 2019-10-01 NOTE — Telephone Encounter (Signed)
Patient called and requested to speak with pcp, following him getting out of surgery. Please fu at your earliest convenience.

## 2019-10-01 NOTE — Telephone Encounter (Signed)
I have spoken to his case manager about his request

## 2019-10-02 ENCOUNTER — Ambulatory Visit (INDEPENDENT_AMBULATORY_CARE_PROVIDER_SITE_OTHER): Payer: Medicaid Other

## 2019-10-02 ENCOUNTER — Other Ambulatory Visit: Payer: Self-pay

## 2019-10-02 ENCOUNTER — Ambulatory Visit (INDEPENDENT_AMBULATORY_CARE_PROVIDER_SITE_OTHER): Payer: Medicaid Other | Admitting: Orthopaedic Surgery

## 2019-10-02 ENCOUNTER — Encounter: Payer: Self-pay | Admitting: Orthopaedic Surgery

## 2019-10-02 VITALS — BP 104/69 | HR 104 | Ht 69.0 in | Wt 205.0 lb

## 2019-10-02 DIAGNOSIS — Z981 Arthrodesis status: Secondary | ICD-10-CM

## 2019-10-02 NOTE — Progress Notes (Signed)
   Post-Op Visit Note   Patient: Jack Fernandez           Date of Birth: 10/12/1965           MRN: [id number] Visit Date: 10/02/2019 PCP: Storm Frisk, MD   Assessment & Plan: Post 3 level fusion for severe stenosis with severe myelopathy.  [facility] at the shelter.  He gets to stay inside during the day.  2 view x-rays show good position of graft plate and screws.  Return 5 weeks for repeat x-rays with lateral flexion-extension x-ray.  Patient notices he is not dragging his left foot is much.  Still has some aching in his left leg.  Numbness in his left arm persists but the pain in his left arm is gone.  He states he is already noticed improvement since the surgery.  Continue with walker for fall prevention.  I discussed with him with his severe myelopathy he may continue to see improvement over several months.  Chief Complaint:  Chief Complaint  Patient presents with  . Neck - Routine Post Op    09/23/2019 C3-4, C4-5, C5-6 ACDF, Allograft, Plate   Visit Diagnoses:  1. Status post cervical spinal fusion     Plan: Continue collar.  Return 5 weeks for lateral flexion-extension C-spine x-ray.  Follow-Up Instructions: No follow-ups on file.   Orders:  Orders Placed This Encounter  Procedures  . XR Cervical Spine 2 or 3 views   No orders of the defined types were placed in this encounter.   Imaging: No results found.  PMFS History: Patient Active Problem List   Diagnosis Date Noted  . Homelessness 09/24/2019  . Cervical stenosis of spine 09/23/2019  . Spinal stenosis of cervical region 09/02/2019  . Other spondylosis with myelopathy, cervical region 09/02/2019  . Spinal stenosis of lumbar region 09/02/2019  . Syncope 08/11/2019  . HTN (hypertension) 07/15/2019  . Neuropathy 06/09/2019  . Chronic left shoulder pain 06/09/2019  . Alcohol use 06/09/2019  . Alcoholic hepatitis without ascites 06/09/2019   Past Medical History:  Diagnosis Date  . Alcoholic hepatitis  without ascites   . Alcoholism (HCC)   . Ambulates with cane   . Anxiety   . Bipolar disorder (HCC)   . Depression   . Hypertension   . Memory loss    mild per patient  . Neuromuscular disorder (HCC)    bilateral hands- neuropathy   . Palpitations     Family History  Problem Relation Age of Onset  . HIV/AIDS Father     Past Surgical History:  Procedure Laterality Date  . ANTERIOR CERVICAL DECOMP/DISCECTOMY FUSION N/A 09/23/2019   Procedure: CERVICAL THREE-FOUR,CERVICAL FOUR-FIVE  , CERVICAL FIVE-SIX  ANTERIOR CERVICAL DECOMPRESSION/DISCECTOMY FUSION, ALLOGRAFT, PLATES, FUSION CERVICAL THREE-CERVICAL SIX;  Surgeon: Eldred Manges, MD;  Location: [facility] OR;  Service: Orthopedics;  Laterality: N/A;  . FINGER SURGERY     pinky - repair tendon - right hand   Social History   Occupational History  . Occupation: not employed  Tobacco Use  . Smoking status: Never Smoker  . Smokeless tobacco: Never Used  Substance and Sexual Activity  . Alcohol use: Yes    Comment: 3 16oz beers and 1 pint of wine daily  . Drug use: No  . Sexual activity: Not on file

## 2019-10-05 NOTE — Discharge Summary (Signed)
Patient ID: Jack Fernandez MRN: [id number] DOB/AGE: 1966/03/01 54 y.o.  Admit date: 09/23/2019 Discharge date: 10/05/2019  Admission Diagnoses:  Active Problems:   Spinal stenosis of cervical region   Cervical stenosis of spine   Discharge Diagnoses:  Active Problems:   Spinal stenosis of cervical region   Cervical stenosis of spine  status post Procedure(s): CERVICAL THREE-FOUR,CERVICAL FOUR-FIVE  , CERVICAL FIVE-SIX  ANTERIOR CERVICAL DECOMPRESSION/DISCECTOMY FUSION, ALLOGRAFT, PLATES, FUSION CERVICAL THREE-CERVICAL SIX  Past Medical History:  Diagnosis Date  . Alcoholic hepatitis without ascites   . Alcoholism (HCC)   . Ambulates with cane   . Anxiety   . Bipolar disorder (HCC)   . Depression   . Hypertension   . Memory loss    mild per patient  . Neuromuscular disorder (HCC)    bilateral hands- neuropathy   . Palpitations     Surgeries: Procedure(s): CERVICAL THREE-FOUR,CERVICAL FOUR-FIVE  , CERVICAL FIVE-SIX  ANTERIOR CERVICAL DECOMPRESSION/DISCECTOMY FUSION, ALLOGRAFT, PLATES, FUSION CERVICAL THREE-CERVICAL SIX on 09/23/2019   Consultants:   Discharged Condition: Improved  Hospital Course: Jack Fernandez is an 54 y.o. male who was admitted 09/23/2019 for operative treatment of cervical stenosis. Patient failed conservative treatments (please see the history and physical for the specifics) and had severe unremitting pain that affects sleep, daily activities and work/hobbies. After pre-op clearance, the patient was taken to the operating room on 09/23/2019 and underwent  Procedure(s): CERVICAL THREE-FOUR,CERVICAL FOUR-FIVE  , CERVICAL FIVE-SIX  ANTERIOR CERVICAL DECOMPRESSION/DISCECTOMY FUSION, ALLOGRAFT, PLATES, FUSION CERVICAL THREE-CERVICAL SIX.    Patient was given perioperative antibiotics:  Anti-infectives (From admission, onward)   Start     Dose/Rate Route Frequency Ordered Stop   09/24/19 0600  ceFAZolin (ANCEF) IVPB 2g/100 mL premix     2 g 200 mL/hr over 30  Minutes Intravenous On call to O.R. 09/23/19 1209 09/24/19 0621   09/23/19 1209  ceFAZolin (ANCEF) 2-4 GM/100ML-% IVPB    Note to Pharmacy: Marcelle Smiling Ch: cabinet override      09/23/19 1209 09/23/19 1253       Patient was given sequential compression devices and early ambulation to prevent DVT.   Patient benefited maximally from hospital stay and there were no complications. At the time of discharge, the patient was urinating/moving their bowels without difficulty, tolerating a regular diet, pain is controlled with oral pain medications and they have been cleared by PT/OT.   Recent vital signs: No data found.   Recent laboratory studies: No results for input(s): WBC, HGB, HCT, PLT, NA, K, CL, CO2, BUN, CREATININE, GLUCOSE, INR, CALCIUM in the last 72 hours.  Invalid input(s): PT, 2   Discharge Medications:   Allergies as of 09/24/2019      Reactions   Amoxicillin Diarrhea      Medication List    TAKE these medications   FLUoxetine 20 MG tablet Commonly known as: PROZAC Take 20 mg by mouth daily.   folic acid 1 MG tablet Commonly known as: FOLVITE Take 1 tablet (1 mg total) by mouth daily. What changed: how much to take   gabapentin 300 MG capsule Commonly known as: NEURONTIN Take 1 capsule (300 mg total) by mouth 3 (three) times daily.   PARoxetine 20 MG tablet Commonly known as: Paxil Take 1 tablet (20 mg total) by mouth daily.   QUEtiapine 50 MG tablet Commonly known as: SEROquel Take 1 tablet (50 mg total) by mouth at bedtime.   thiamine 100 MG tablet Commonly known as: VITAMIN B-1 Take 1 tablet (  100 mg total) by mouth daily.       Diagnostic Studies: DG Chest 2 View  Result Date: 09/16/2019 CLINICAL DATA:  Preop ACDF EXAM: CHEST - 2 VIEW COMPARISON:  None. FINDINGS: There is mild left basilar atelectasis. There is a 3 cm focal airspace opacity projecting over the thoracic spine likely within the right upper medially, but not well visualized on the  frontal view. There is no focal consolidation. There is no pleural effusion or pneumothorax. The heart and mediastinal contours are unremarkable. There is no acute osseous abnormality. IMPRESSION: No acute cardiopulmonary disease. 3 cm focal airspace opacity projecting over the thoracic spine likely within the right upper medially, but not well visualized on the frontal view. Recommend further evaluation with a CT of the chest. Electronically Signed   By: Kathreen Devoid   On: 09/16/2019 16:20   DG Cervical Spine 2-3 Views  Result Date: 09/23/2019 CLINICAL DATA:  C4 corpectomy with C3-C6 ACDF EXAM: CERVICAL SPINE - 2-3 VIEW COMPARISON:  MR cervical spine 08/24/2019 FLUOROSCOPY TIME:  0 minutes 35 seconds Images submitted: 3 FINDINGS: Osseous demineralization. AP and lateral views of the cervical spine intraoperatively demonstrate osseous demineralization. Anterior plate and screws identified at C3-C6. No fracture, subluxation, or bone destruction. IMPRESSION: Anterior fusion C3-C6. Electronically Signed   By: Lavonia Dana M.D.   On: 09/23/2019 16:40   CT CHEST WO CONTRAST  Result Date: 09/24/2019 CLINICAL DATA:  Chest radiograph demonstrating density projecting over the thoracic spine on lateral view. Pleural effusion. EXAM: CT CHEST WITHOUT CONTRAST TECHNIQUE: Multidetector CT imaging of the chest was performed following the standard protocol without IV contrast. COMPARISON:  Chest radiograph 09/16/2019 and 01/21/2019. CTA chest 04/27/2015. FINDINGS: Cardiovascular: Aortic atherosclerosis. Tortuous thoracic aorta. Normal heart size. Multivessel coronary artery atherosclerosis. Mediastinum/Nodes: No mediastinal or definite hilar adenopathy, given limitations of unenhanced CT. Lungs/Pleura: No pleural fluid. Right hemidiaphragm elevation. Subsegmental atelectasis dependently in the right lower lobe. No suspicious pulmonary nodule or mass. Upper Abdomen: Moderate hepatic steatosis. Normal imaged portions of the  spleen, stomach, pancreas, gallbladder, adrenal glands, kidneys. Musculoskeletal: Lower cervical spine fixation. Prominent osteophytes about the lateral thoracic spine. Osteophytes on the left at C5-6 and on the right at C6-7 may have accounted for the plain film abnormality. IMPRESSION: 1. No acute process in the chest. The plain film abnormality was most likely related to prominent upper thoracic osteophytes. 2. Right hemidiaphragm elevation with dependent right lower lobe atelectasis. 3. Age advanced coronary artery atherosclerosis. Recommend assessment of coronary risk factors and consideration of medical therapy. 4.  Aortic Atherosclerosis (ICD10-I70.0). 5. Hepatic steatosis Electronically Signed   By: Abigail Miyamoto M.D.   On: 09/24/2019 10:56   DG C-Arm 1-60 Min  Result Date: 09/23/2019 CLINICAL DATA:  54 year old male with C4 corpectomy and C3-C6 ACDF. EXAM: DG C-ARM 1-60 MIN COMPARISON:  Cervical spine CT dated 08/11/2018. FINDINGS: Three fluoroscopic intraoperative spot images provided. The total fluoroscopic time is 35 seconds. C3-C6 ACDF.  Partially visualized endotracheal tube. IMPRESSION: Intraop for Skopic images of C3-C6 ACDF. Electronically Signed   By: Anner Crete M.D.   On: 09/23/2019 16:49   XR Cervical Spine 2 or 3 views  Result Date: 10/02/2019 AP lateral cervical spine x-rays demonstrate 3 level cervical fusion C3-4-C4-5, C5-6.  Good position of graft plate and screws. Impression: Satisfactory 3 level cervical fusion C3-4, C4-5, C5-6.     Follow-up Information    Marybelle Killings, MD Follow up in 2 week(s).   Specialty: Orthopedic Surgery Contact  information: [address] [place] [address] [phone]           Discharge Plan:  discharge to home  Disposition:     Signed: Zonia Kief for Annell Greening MD 10/05/2019, 10:16 AM

## 2019-10-07 ENCOUNTER — Other Ambulatory Visit: Payer: Self-pay | Admitting: Critical Care Medicine

## 2019-10-07 ENCOUNTER — Encounter: Payer: Self-pay | Admitting: Critical Care Medicine

## 2019-10-07 MED ORDER — GABAPENTIN 300 MG PO CAPS: 300.0000 mg | ORAL_CAPSULE | Freq: Three times a day (TID) | ORAL | 1 refills | Status: DC

## 2019-10-07 MED FILL — GABAPENTIN 300 MG CAPSULE: 300 | 30 days supply | Qty: 90 | Fill #1

## 2019-10-07 NOTE — Progress Notes (Signed)
Refills

## 2019-10-07 NOTE — Congregational Nurse Program (Signed)
Mr. Nelis seen in walk-in clinic with Dr.Wright for medication refill. Ambulating with assistance of walker,proper use noted. CN will pick up medication refill at [facility] pharmacy.

## 2019-10-08 NOTE — Progress Notes (Signed)
Patient ID: Jack Fernandez, male   DOB: 11-Apr-1966, 54 y.o.   MRN: [id number] This is a follow-up visit for this 54 year old male whose had recent anterior C-spine surgery and is recovering from this.  He had his postop visit with orthopedics.  He was admonished to use his walker more and his cane less until he recovers further.  He states his pain level is improved and he is now off the Percocet using gabapentin alone and nonsteroidals he is ambulating further as well  The patient does need a refill on the gabapentin and I counseled him as well regarding the need to take it easy moves slowly and I also signed a letter that he can go upstairs to lie down some late in the afternoon to rest  [facility]

## 2019-10-13 ENCOUNTER — Other Ambulatory Visit: Payer: Self-pay

## 2019-10-13 ENCOUNTER — Encounter: Payer: Self-pay | Admitting: Critical Care Medicine

## 2019-10-13 ENCOUNTER — Ambulatory Visit: Payer: Self-pay | Attending: Critical Care Medicine | Admitting: Critical Care Medicine

## 2019-10-13 VITALS — BP 113/78 | HR 104 | Temp 97.8°F | Resp 104 | Ht 70.0 in | Wt 207.0 lb

## 2019-10-13 DIAGNOSIS — G959 Disease of spinal cord, unspecified: Secondary | ICD-10-CM

## 2019-10-13 DIAGNOSIS — Z789 Other specified health status: Secondary | ICD-10-CM

## 2019-10-13 DIAGNOSIS — Z114 Encounter for screening for human immunodeficiency virus [HIV]: Secondary | ICD-10-CM

## 2019-10-13 DIAGNOSIS — F3341 Major depressive disorder, recurrent, in partial remission: Secondary | ICD-10-CM

## 2019-10-13 DIAGNOSIS — G629 Polyneuropathy, unspecified: Secondary | ICD-10-CM

## 2019-10-13 DIAGNOSIS — I10 Essential (primary) hypertension: Secondary | ICD-10-CM

## 2019-10-13 DIAGNOSIS — M4802 Spinal stenosis, cervical region: Secondary | ICD-10-CM

## 2019-10-13 DIAGNOSIS — F329 Major depressive disorder, single episode, unspecified: Secondary | ICD-10-CM | POA: Insufficient documentation

## 2019-10-13 DIAGNOSIS — M4712 Other spondylosis with myelopathy, cervical region: Secondary | ICD-10-CM

## 2019-10-13 DIAGNOSIS — Z7289 Other problems related to lifestyle: Secondary | ICD-10-CM

## 2019-10-13 DIAGNOSIS — F32A Depression, unspecified: Secondary | ICD-10-CM | POA: Insufficient documentation

## 2019-10-13 MED ORDER — GABAPENTIN 300 MG PO CAPS: 600.0000 mg | ORAL_CAPSULE | Freq: Four times a day (QID) | ORAL | 0 refills | Status: DC

## 2019-10-13 MED ORDER — QUETIAPINE FUMARATE 50 MG PO TABS: 50.0000 mg | ORAL_TABLET | Freq: Every day | ORAL | 2 refills | Status: DC
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
## 2019-10-13 NOTE — Progress Notes (Signed)
F /u  c /o pain 6/10 of left shoulder and left leg

## 2019-10-13 NOTE — Assessment & Plan Note (Signed)
Patient states he is no longer taking alcohol at this time

## 2019-10-13 NOTE — Assessment & Plan Note (Signed)
History of depression and also posttraumatic stress currently the patient is on Paxil 20 mg daily and Seroquel 50 mg at bedtime  He does need refills of his Seroquel we will prescribe that at this time for his insomnia

## 2019-10-13 NOTE — Assessment & Plan Note (Signed)
Hypertension under good control at this time  We will continue amlodipine 5 mg daily and obtain a metabolic panel

## 2019-10-13 NOTE — Assessment & Plan Note (Signed)
Cervical spondylosis with myelopathy improved somewhat with cervical laminectomy however continue myelopathy exists and this is the cause of the chronic right shoulder pain and left lower extremity pain  We will increase the gabapentin to 600 mg 4 times daily

## 2019-10-13 NOTE — Assessment & Plan Note (Signed)
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

## 2019-10-13 NOTE — Patient Instructions (Signed)
HIV and metabolic panel for labs today  Wear depends at night under your sleeping clothes for protection  Increase Gabapening to two capsules 4 times a day  Refill on gabapentin and seroquel sent to our pharmacy  Follow up Dr Delford Field in two months

## 2019-10-14 LAB — COMPREHENSIVE METABOLIC PANEL
ALT: 6 IU/L (ref 0–44)
AST: 18 IU/L (ref 0–40)
Albumin/Globulin Ratio: 1.5 (ref 1.2–2.2)
Albumin: 4.7 g/dL (ref 3.8–4.9)
Alkaline Phosphatase: 83 IU/L (ref 39–117)
BUN/Creatinine Ratio: 10 (ref 9–20)
BUN: 8 mg/dL (ref 6–24)
Bilirubin Total: 0.5 mg/dL (ref 0.0–1.2)
CO2: 20 mmol/L (ref 20–29)
Calcium: 9.7 mg/dL (ref 8.7–10.2)
Chloride: 103 mmol/L (ref 96–106)
Creatinine, Ser: 0.79 mg/dL (ref 0.76–1.27)
GFR calc Af Amer: 116 mL/min/{1.73_m2} (ref >59)
GFR calc non Af Amer: 100 mL/min/{1.73_m2} (ref >59)
Globulin, Total: 3.2 g/dL (ref 1.5–4.5)
Glucose: 101 mg/dL — ABNORMAL HIGH (ref 65–99)
Potassium: 4.4 mmol/L (ref 3.5–5.2)
Sodium: 139 mmol/L (ref 134–144)
Total Protein: 7.9 g/dL (ref 6.0–8.5)

## 2019-10-14 LAB — HIV ANTIBODY (ROUTINE TESTING W REFLEX): HIV Screen 4th Generation wRfx: NONREACTIVE

## 2019-10-14 MED FILL — QUETIAPINE FUMARATE 50 MG T: 50 | 30 days supply | Qty: 30 | Fill #0

## 2019-10-14 MED FILL — ?FLUOXETINE HCL 20 MG CAPS: 20 | 30 days supply | Qty: 30 | Fill #0

## 2019-10-14 NOTE — Congregational Nurse Program (Signed)
Mr. Delone seen at clinic with Dr. Delford Field who advised pt.at his next appointment at [facility], request referral for physical therapy.

## 2019-10-19 MED FILL — GABAPENTIN 300 MG CAPSULE: 300 | 30 days supply | Qty: 240 | Fill #0

## 2019-10-23 ENCOUNTER — Other Ambulatory Visit: Payer: Self-pay

## 2019-10-23 DIAGNOSIS — Z20822 Contact with and (suspected) exposure to covid-19: Secondary | ICD-10-CM

## 2019-10-25 LAB — NOVEL CORONAVIRUS, NAA: SARS-CoV-2, NAA: NOT DETECTED

## 2019-11-06 ENCOUNTER — Ambulatory Visit (INDEPENDENT_AMBULATORY_CARE_PROVIDER_SITE_OTHER): Payer: Medicaid Other

## 2019-11-06 ENCOUNTER — Other Ambulatory Visit: Payer: Self-pay

## 2019-11-06 ENCOUNTER — Ambulatory Visit (INDEPENDENT_AMBULATORY_CARE_PROVIDER_SITE_OTHER): Payer: Self-pay | Admitting: Orthopaedic Surgery

## 2019-11-06 ENCOUNTER — Encounter: Payer: Self-pay | Admitting: Orthopaedic Surgery

## 2019-11-06 VITALS — BP 135/94 | HR 109 | Ht 70.0 in | Wt 207.0 lb

## 2019-11-06 DIAGNOSIS — Z981 Arthrodesis status: Secondary | ICD-10-CM | POA: Diagnosis not present

## 2019-11-06 NOTE — Progress Notes (Signed)
Post-Op Visit Note   Patient: Jack Fernandez           Date of Birth: 13-Sep-1965           MRN: [id number] Visit Date: 11/06/2019 PCP: Storm Frisk, MD   Assessment & Plan: Patient noticed improvement in his left leg ability to lift his leg up.  He has shoes instead of 3 days and is already dragged the toe on and off so he has a portion of the sole worn off.  He states that he thinks about it and conscious of his gait he lifts his leg up and does not drag the toe but if he is going fast sometimes it drags.  He is waiting on getting Medicaid we discussed ordering off-the-shelf AFO that he could use for period of time.  Since decompression for his myelopathy is noticed improvement in his gait and improvement in sensation in his hands.  X-rays show satisfactory position of all 3 grafts.  Return 2 months for lateral flexion extension out of collar x-rays.  Chief Complaint:  Chief Complaint  Patient presents with  . Neck - Follow-up    09/23/2019 C3-4,C4-5,C5-6 ACDF, Allograft, Plate   Visit Diagnoses:  1. Status post cervical spinal fusion     Plan: Continue collar x2 months.  On return repeat lateral flexion-extension x-rays.  He has severe stenosis at L4-5 with congenital and acquired stenosis.  Some of his ankle dorsiflexion weakness may be rate related to lumbar spine.  A significant portion is coming from his cervical myelopathy since he has gotten improvement in gait and strength unable to lift his leg since his surgery and no longer drags it constantly.  He should continue to improve and I discussed with him we can address his lumbar problem later once his neck is healed.  He states one time he took the collar off because it was too hot where he was staying we discussed the importance of keeping his collar on so he does not have to have more surgery on his neck and that if the fusion does not heal solidly he may have the screws and plate pulled loose requiring repeat surgery.  He  understands the problems to keep his brace on.  X-rays on return as above.  Follow-Up Instructions: No follow-ups on file.   Orders:  Orders Placed This Encounter  Procedures  . XR Cervical Spine 2 or 3 views   No orders of the defined types were placed in this encounter.   Imaging: No results found.  PMFS History: Patient Active Problem List   Diagnosis Date Noted  . Depression 10/13/2019  . Myelopathy (H[facility]) 10/13/2019  . Homelessness 09/24/2019  . Cervical stenosis of spine 09/23/2019  . Spinal stenosis of cervical region 09/02/2019  . Other spondylosis with myelopathy, cervical region 09/02/2019  . Spinal stenosis of lumbar region 09/02/2019  . HTN (hypertension) 07/15/2019  . Neuropathy 06/09/2019  . Alcohol use 06/09/2019   Past Medical History:  Diagnosis Date  . Alcoholic hepatitis without ascites   . Alcoholism ([facility])   . Ambulates with cane   . Anxiety   . Bipolar disorder ([facility])   . Depression   . Hypertension   . Memory loss    mild per patient  . Neuromuscular disorder ([facility])    bilateral hands- neuropathy   . Palpitations     Family History  Problem Relation Age of Onset  . HIV/AIDS Father     Past Surgical History:  Procedure Laterality Date  . ANTERIOR CERVICAL DECOMP/DISCECTOMY FUSION N/A 09/23/2019   Procedure: CERVICAL THREE-FOUR,CERVICAL FOUR-FIVE  , CERVICAL FIVE-SIX  ANTERIOR CERVICAL DECOMPRESSION/DISCECTOMY FUSION, ALLOGRAFT, PLATES, FUSION CERVICAL THREE-CERVICAL SIX;  Surgeon: Marybelle Killings, MD;  Location: [place];  Service: Orthopedics;  Laterality: N/A;  . FINGER SURGERY     pinky - repair tendon - right hand   Social History   Occupational History  . Occupation: not employed  Tobacco Use  . Smoking status: Never Smoker  . Smokeless tobacco: Never Used  Substance and Sexual Activity  . Alcohol use: Yes    Comment: 3 16oz beers and 1 pint of wine daily  . Drug use: No  . Sexual activity: Not on file

## 2019-11-13 ENCOUNTER — Other Ambulatory Visit: Payer: Self-pay | Admitting: Critical Care Medicine

## 2019-11-13 ENCOUNTER — Encounter: Payer: Self-pay | Admitting: Critical Care Medicine

## 2019-11-13 DIAGNOSIS — Z9889 Other specified postprocedural states: Secondary | ICD-10-CM

## 2019-11-13 DIAGNOSIS — M4712 Other spondylosis with myelopathy, cervical region: Secondary | ICD-10-CM

## 2019-11-13 DIAGNOSIS — R1314 Dysphagia, pharyngoesophageal phase: Secondary | ICD-10-CM

## 2019-11-13 MED ORDER — GABAPENTIN 300 MG PO CAPS: 600.0000 mg | ORAL_CAPSULE | Freq: Four times a day (QID) | ORAL | 0 refills | Status: DC

## 2019-11-13 MED FILL — GABAPENTIN 300 MG CAPSULE: 300 | 30 days supply | Qty: 240 | Fill #0

## 2019-11-13 NOTE — Progress Notes (Signed)
Patient ID: Jack Fernandez, male   DOB: December 20, 1965, 54 y.o.   MRN: [id number] This is a 54 year old male who had recent cervical spine surgery and has been only intermittently wearing his neck brace and he notes progression in this aphasia over the past 2 weeks.  Is to the point where he cannot swallow solid foods such as toast lasagna or meats.  He will have increased cough at night.  He is able to swallow liquids well at this time.  He lays on his side his pain in his legs are worse but if he lays on his back and stretches out he is better his arm pain is improved his leg strength has improved  On exam blood pressure 112/76 pulse 122 saturation 96% room air temp 99 6  I examined the patient's neck and there is no evidence of disruption of the incision and there is no evidence of hematoma or any other swelling in the left side of the neck at the site of the patient's anterior surgery for the cervical spine.  I have the patient swallow a glass of water and this occurs easily.  He does not choke or cough.  Oral exam is unremarkable.  Chest was clear cardiac exam was unremarkable  Reviewing the images taken at the last visit with orthopedics it looks like the plate and the lower portion of the plate alignment is only slightly 1 mm shifted this does not appear to be enough to cause esophageal in apparent however I will go ahead and make a referral for modified barium swallow  Plan is to obtain modified barium swallow with speech pathology input is and in the interim the patient will stay on a liquid diet we will also obtain for him Ensure  Also at this visit I had the patient stop his amlodipine Paxil and Seroquel but he will maintain gabapentin 600 mg 4 times daily and I renewed this prescription

## 2019-11-13 NOTE — Progress Notes (Signed)
Med refill

## 2019-11-13 NOTE — Progress Notes (Signed)
Dysphagia, see other note

## 2019-11-13 NOTE — Congregational Nurse Program (Signed)
Jack Fernandez seen at [facility] for c/o pain to L arm, difficulty swallowing solid foods. Dr. Delford Field will make referral for evaluation. CN will pick-up and deliver medication at [facility]

## 2019-11-19 ENCOUNTER — Other Ambulatory Visit: Payer: Self-pay | Admitting: Critical Care Medicine

## 2019-11-19 ENCOUNTER — Encounter: Payer: Self-pay | Admitting: Critical Care Medicine

## 2019-11-19 NOTE — Congregational Nurse Program (Signed)
Mr. Rusher seen at [facility] clinic for f/u s/p surgery. Wearing neck brace and ambulating with rolling walker. C/O numbness to Left leg and arm esp.during the night. Encouraged to continue to take medications for discomfort relief and keep Ortho appointment.

## 2019-11-20 NOTE — Progress Notes (Signed)
Patient ID: Jack Fernandez, male   DOB: 1966-03-21, 54 y.o.   MRN: [id number]  This patient is seen back in return to [facility] Patient states his swallowing is improved since he wore his neck brace more often.  He states the gabapentin has helped his arm and leg pain.  Blood pressure 134/86 saturation 98% room air pulse 102  Impression is this patient needs to be more compliant with his full liquid diet and wear his neck brace more often and take nutritional supplements

## 2019-12-03 ENCOUNTER — Other Ambulatory Visit: Payer: Self-pay | Admitting: Critical Care Medicine

## 2019-12-03 ENCOUNTER — Encounter: Payer: Self-pay | Admitting: Critical Care Medicine

## 2019-12-03 DIAGNOSIS — M4802 Spinal stenosis, cervical region: Secondary | ICD-10-CM

## 2019-12-03 MED ORDER — AMLODIPINE BESYLATE 5 MG PO TABS: 5.0000 mg | ORAL_TABLET | Freq: Every day | ORAL | 3 refills | Status: AC

## 2019-12-03 MED ORDER — GABAPENTIN 300 MG PO CAPS: 600.0000 mg | ORAL_CAPSULE | Freq: Four times a day (QID) | ORAL | 0 refills | Status: AC

## 2019-12-03 MED ORDER — LORATADINE 10 MG PO TABS: 10.0000 mg | ORAL_TABLET | Freq: Every day | ORAL | 4 refills | Status: AC

## 2019-12-03 MED FILL — AMLODIPINE BESYLATE 5 MG TA: 5 | 30 days supply | Qty: 30 | Fill #0

## 2019-12-03 NOTE — Progress Notes (Signed)
Cervical collar

## 2019-12-03 NOTE — Progress Notes (Signed)
Refills given.

## 2019-12-04 ENCOUNTER — Encounter: Payer: Self-pay | Admitting: Critical Care Medicine

## 2019-12-04 MED FILL — ?LORATADINE 10 MG TABS: 10 | 30 days supply | Qty: 30 | Fill #0

## 2019-12-04 NOTE — Progress Notes (Signed)
Patient ID: Jack Fernandez, male   DOB: 1965-12-07, 54 y.o.   MRN: [id number] This is a 54 year old male who is seen in the [facility].  He does have a new walker which is able to allow him to ambulate better.  He is in need of a new neck brace.  He had previous cervical spine surgery.  He is applying for disability has an upcoming appoint with [employer] and question whether he should be back on Paxil and Seroquel.  The patient is about out of gabapentin complains of allergic rhinitis and conjunctivitis and is about out of his amlodipine as well.  On exam blood pressure 101/74 pulse 113  My impression the patient has allergic rhinitis and he will receive a prescription for loratadine 10 mg daily he will have refills of gabapentin when it is coming due and he will get his amlodipine refilled as well.  I also gave him the case of Ensure to help with his nutritional status with his previous history of dysphagia

## 2019-12-14 MED FILL — GABAPENTIN 300 MG CAPSULE: 300 | 30 days supply | Qty: 240 | Fill #0

## 2019-12-15 ENCOUNTER — Other Ambulatory Visit: Payer: Self-pay

## 2019-12-15 ENCOUNTER — Ambulatory Visit: Payer: Medicaid Other | Attending: Critical Care Medicine | Admitting: Critical Care Medicine

## 2019-12-15 ENCOUNTER — Encounter: Payer: Self-pay | Admitting: Critical Care Medicine

## 2019-12-15 VITALS — BP 125/83 | HR 118 | Temp 98.1°F | Ht 70.0 in | Wt 199.2 lb

## 2019-12-15 DIAGNOSIS — F3341 Major depressive disorder, recurrent, in partial remission: Secondary | ICD-10-CM

## 2019-12-15 DIAGNOSIS — R310 Gross hematuria: Secondary | ICD-10-CM

## 2019-12-15 DIAGNOSIS — R1314 Dysphagia, pharyngoesophageal phase: Secondary | ICD-10-CM

## 2019-12-15 DIAGNOSIS — I10 Essential (primary) hypertension: Secondary | ICD-10-CM | POA: Diagnosis not present

## 2019-12-15 DIAGNOSIS — F1090 Alcohol use, unspecified, uncomplicated: Secondary | ICD-10-CM

## 2019-12-15 DIAGNOSIS — G959 Disease of spinal cord, unspecified: Secondary | ICD-10-CM

## 2019-12-15 DIAGNOSIS — Z7289 Other problems related to lifestyle: Secondary | ICD-10-CM

## 2019-12-15 DIAGNOSIS — G629 Polyneuropathy, unspecified: Secondary | ICD-10-CM

## 2019-12-15 DIAGNOSIS — Z789 Other specified health status: Secondary | ICD-10-CM

## 2019-12-15 MED ORDER — PAROXETINE HCL 20 MG PO TABS: 20.0000 mg | ORAL_TABLET | Freq: Every day | ORAL | 3 refills | Status: AC

## 2019-12-15 MED FILL — PARoxetine HCL 20 MG TABS: 20 | 30 days supply | Qty: 30 | Fill #0

## 2019-12-15 NOTE — Assessment & Plan Note (Signed)
Neuropathic pain persist therefore gabapentin 600 mg 4 times daily was resumed

## 2019-12-15 NOTE — Assessment & Plan Note (Addendum)
Myelopathy is improving slowly after cervical spine surgery

## 2019-12-15 NOTE — Assessment & Plan Note (Signed)
As per alcohol assessment

## 2019-12-15 NOTE — Assessment & Plan Note (Signed)
Will obtain urinalysis and may yet require urology consultation

## 2019-12-15 NOTE — Patient Instructions (Addendum)
Labs today:  Urine drug screen, blood counts, metablolic panel, urinalysis  Resume paxil /paroxitine one daily  Stay on amlodipine/gabapentin/ loratidine  Schedule a future visit with [facility] [employer]  Return to Dr Delford Field in 4 months  Letter prepared for [facility].
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
## 2019-12-15 NOTE — Assessment & Plan Note (Signed)
Dysphagia has resolved

## 2019-12-15 NOTE — Assessment & Plan Note (Signed)
Hypertension under improved control we will continue amlodipine

## 2019-12-15 NOTE — Progress Notes (Signed)
See med student notes please

## 2019-12-15 NOTE — Assessment & Plan Note (Signed)
Continued alcohol use in this patient will again make another referral to behavioral health services and begin Paxil 20 mg daily

## 2019-12-16 ENCOUNTER — Telehealth: Payer: Self-pay | Admitting: Critical Care Medicine

## 2019-12-16 DIAGNOSIS — N3000 Acute cystitis without hematuria: Secondary | ICD-10-CM | POA: Insufficient documentation

## 2019-12-16 LAB — CBC WITH DIFFERENTIAL/PLATELET
Basophils Absolute: 0.1 10*3/uL (ref 0.0–0.2)
Basos: 2 %
EOS (ABSOLUTE): 0.1 10*3/uL (ref 0.0–0.4)
Eos: 2 %
Hematocrit: 37.9 % (ref 37.5–51.0)
Hemoglobin: 12.7 g/dL — ABNORMAL LOW (ref 13.0–17.7)
Immature Grans (Abs): 0 10*3/uL (ref 0.0–0.1)
Immature Granulocytes: 1 %
Lymphocytes Absolute: 2.7 10*3/uL (ref 0.7–3.1)
Lymphs: 42 %
MCH: 28.8 pg (ref 26.6–33.0)
MCHC: 33.5 g/dL (ref 31.5–35.7)
MCV: 86 fL (ref 79–97)
Monocytes Absolute: 0.4 10*3/uL (ref 0.1–0.9)
Monocytes: 7 %
Neutrophils Absolute: 3 10*3/uL (ref 1.4–7.0)
Neutrophils: 46 %
Platelets: 158 10*3/uL (ref 150–450)
RBC: 4.41 x10E6/uL (ref 4.14–5.80)
RDW: 14.7 % (ref 11.6–15.4)
WBC: 6.4 10*3/uL (ref 3.4–10.8)

## 2019-12-16 LAB — COMPREHENSIVE METABOLIC PANEL
ALT: 6 IU/L (ref 0–44)
AST: 26 IU/L (ref 0–40)
Albumin/Globulin Ratio: 1.1 — ABNORMAL LOW (ref 1.2–2.2)
Albumin: 4.2 g/dL (ref 3.8–4.9)
Alkaline Phosphatase: 86 IU/L (ref 39–117)
BUN/Creatinine Ratio: 8 — ABNORMAL LOW (ref 9–20)
BUN: 7 mg/dL (ref 6–24)
Bilirubin Total: 0.5 mg/dL (ref 0.0–1.2)
CO2: 17 mmol/L — ABNORMAL LOW (ref 20–29)
Calcium: 9.3 mg/dL (ref 8.7–10.2)
Chloride: 107 mmol/L — ABNORMAL HIGH (ref 96–106)
Creatinine, Ser: 0.87 mg/dL (ref 0.76–1.27)
GFR calc Af Amer: 112 mL/min/{1.73_m2} (ref >59)
GFR calc non Af Amer: 96 mL/min/{1.73_m2} (ref >59)
Globulin, Total: 3.7 g/dL (ref 1.5–4.5)
Glucose: 92 mg/dL (ref 65–99)
Potassium: 4.4 mmol/L (ref 3.5–5.2)
Sodium: 143 mmol/L (ref 134–144)
Total Protein: 7.9 g/dL (ref 6.0–8.5)

## 2019-12-16 LAB — URINALYSIS, COMPLETE
Bilirubin, UA: NEGATIVE
Glucose, UA: NEGATIVE
Nitrite, UA: NEGATIVE
RBC, UA: NEGATIVE
Specific Gravity, UA: 1.02 (ref 1.005–1.030)
Urobilinogen, Ur: 0.2 mg/dL (ref 0.2–1.0)
pH, UA: 5 (ref 5.0–7.5)

## 2019-12-16 LAB — MICROSCOPIC EXAMINATION
Bacteria, UA: NONE SEEN
Casts: NONE SEEN /lpf
RBC, Urine: NONE SEEN /hpf (ref 0–2)
WBC, UA: >30 /hpf — AB (ref 0–5)

## 2019-12-16 MED ORDER — CIPROFLOXACIN HCL 500 MG PO TABS: 500.0000 mg | ORAL_TABLET | Freq: Two times a day (BID) | ORAL | 0 refills | Status: AC

## 2019-12-16 NOTE — Telephone Encounter (Signed)
Pt aware of lab results  Has a UTI  Will Rx cipro x 5 days and f/u urine studies

## 2019-12-17 MED FILL — CIPROFLOXACIN HCL 500 MG TA: 500 | 5 days supply | Qty: 10 | Fill #0

## 2019-12-18 LAB — DRUG SCREEN 764883 11+OXYCO+ALC+CRT-BUND
Amphetamines, Urine: NEGATIVE ng/mL
BENZODIAZ UR QL: NEGATIVE ng/mL
Barbiturate: NEGATIVE ng/mL
Cannabinoid Quant, Ur: NEGATIVE ng/mL
Cocaine (Metabolite): NEGATIVE ng/mL
Creatinine: 166.4 mg/dL (ref 20.0–300.0)
Meperidine: NEGATIVE ng/mL
Methadone Screen, Urine: NEGATIVE ng/mL
OPIATE SCREEN URINE: NEGATIVE ng/mL
Oxycodone/Oxymorphone, Urine: NEGATIVE ng/mL
Phencyclidine: NEGATIVE ng/mL
Propoxyphene: NEGATIVE ng/mL
Tramadol: NEGATIVE ng/mL
pH, Urine: 5.4 (ref 4.5–8.9)

## 2019-12-18 LAB — PANEL 764016: Ethanol: 0.14 %

## 2019-12-30 ENCOUNTER — Encounter: Payer: Self-pay | Admitting: Critical Care Medicine

## 2019-12-30 NOTE — Progress Notes (Signed)
Patient ID: Jack Fernandez, male   DOB: [birth year], 54 y.o.   MRN: [id number] This is a 54 year old male who is seen frequently in the shelter clinic today he complains of increased numbness in the left hand he status post cervical spine surgery.  Also recently took a course of antibiotics for urinary tract infection since that time has had increased diarrhea that is loose and watery and mild abdominal pain he continues to drink 2 beers daily  On exam rectal exam shows thin watery stool heme-negative abdomen shows mild tenderness in the epigastric area chest was clear cardiac exam unremarkable  Left hand shows good strength  Impression is that of antibiotic associated diarrhea for this with the patient will receive a 10-day course of Culturelle which she will take 1 daily as a probiotic  The patient has follow-up with orthopedics he is encouraged to keep that follow-up and he is recommended to reduce his alcohol intake

## 2019-12-30 NOTE — Congregational Nurse Program (Signed)
Jack Fernandez was seen at [facility] walk-in clinic for c/o L arm numbness. Will f/u appointment with Dr. Ophelia Charter and make pt. Aware  of date and time

## 2020-01-05 ENCOUNTER — Inpatient Hospital Stay (HOSPITAL_COMMUNITY)
Admission: EM | Admit: 2020-01-05 | Discharge: 2020-01-09 | DRG: 871 | Disposition: E | Payer: Medicaid Other | Attending: Critical Care Medicine | Admitting: Critical Care Medicine

## 2020-01-05 ENCOUNTER — Ambulatory Visit: Payer: Self-pay | Admitting: Orthopaedic Surgery

## 2020-01-05 ENCOUNTER — Encounter (HOSPITAL_COMMUNITY): Payer: Self-pay

## 2020-01-05 ENCOUNTER — Emergency Department (HOSPITAL_COMMUNITY): Payer: Medicaid Other

## 2020-01-05 ENCOUNTER — Other Ambulatory Visit: Payer: Self-pay

## 2020-01-05 DIAGNOSIS — I81 Portal vein thrombosis: Secondary | ICD-10-CM | POA: Diagnosis present

## 2020-01-05 DIAGNOSIS — Z20822 Contact with and (suspected) exposure to covid-19: Secondary | ICD-10-CM | POA: Diagnosis present

## 2020-01-05 DIAGNOSIS — K859 Acute pancreatitis without necrosis or infection, unspecified: Secondary | ICD-10-CM | POA: Diagnosis present

## 2020-01-05 DIAGNOSIS — K760 Fatty (change of) liver, not elsewhere classified: Secondary | ICD-10-CM | POA: Diagnosis present

## 2020-01-05 DIAGNOSIS — R000 Tachycardia, unspecified: Secondary | ICD-10-CM

## 2020-01-05 DIAGNOSIS — R17 Unspecified jaundice: Secondary | ICD-10-CM

## 2020-01-05 DIAGNOSIS — K8591 Acute pancreatitis with uninfected necrosis, unspecified: Secondary | ICD-10-CM | POA: Diagnosis not present

## 2020-01-05 DIAGNOSIS — R0682 Tachypnea, not elsewhere classified: Secondary | ICD-10-CM

## 2020-01-05 DIAGNOSIS — Z9911 Dependence on respirator [ventilator] status: Secondary | ICD-10-CM | POA: Diagnosis not present

## 2020-01-05 DIAGNOSIS — K529 Noninfective gastroenteritis and colitis, unspecified: Secondary | ICD-10-CM | POA: Diagnosis present

## 2020-01-05 DIAGNOSIS — G629 Polyneuropathy, unspecified: Secondary | ICD-10-CM | POA: Diagnosis present

## 2020-01-05 DIAGNOSIS — I959 Hypotension, unspecified: Secondary | ICD-10-CM

## 2020-01-05 DIAGNOSIS — R7401 Elevation of levels of liver transaminase levels: Secondary | ICD-10-CM

## 2020-01-05 DIAGNOSIS — R6521 Severe sepsis with septic shock: Secondary | ICD-10-CM | POA: Diagnosis present

## 2020-01-05 DIAGNOSIS — N170 Acute kidney failure with tubular necrosis: Secondary | ICD-10-CM | POA: Diagnosis present

## 2020-01-05 DIAGNOSIS — F419 Anxiety disorder, unspecified: Secondary | ICD-10-CM | POA: Diagnosis present

## 2020-01-05 DIAGNOSIS — Z7289 Other problems related to lifestyle: Secondary | ICD-10-CM

## 2020-01-05 DIAGNOSIS — I10 Essential (primary) hypertension: Secondary | ICD-10-CM | POA: Diagnosis present

## 2020-01-05 DIAGNOSIS — E872 Acidosis, unspecified: Secondary | ICD-10-CM

## 2020-01-05 DIAGNOSIS — Z452 Encounter for adjustment and management of vascular access device: Secondary | ICD-10-CM

## 2020-01-05 DIAGNOSIS — J9600 Acute respiratory failure, unspecified whether with hypoxia or hypercapnia: Secondary | ICD-10-CM | POA: Diagnosis present

## 2020-01-05 DIAGNOSIS — F1020 Alcohol dependence, uncomplicated: Secondary | ICD-10-CM | POA: Diagnosis present

## 2020-01-05 DIAGNOSIS — A419 Sepsis, unspecified organism: Secondary | ICD-10-CM | POA: Diagnosis present

## 2020-01-05 DIAGNOSIS — K8521 Alcohol induced acute pancreatitis with uninfected necrosis: Secondary | ICD-10-CM | POA: Diagnosis present

## 2020-01-05 DIAGNOSIS — Z79899 Other long term (current) drug therapy: Secondary | ICD-10-CM | POA: Diagnosis not present

## 2020-01-05 DIAGNOSIS — Z789 Other specified health status: Secondary | ICD-10-CM

## 2020-01-05 DIAGNOSIS — Z978 Presence of other specified devices: Secondary | ICD-10-CM

## 2020-01-05 DIAGNOSIS — F1090 Alcohol use, unspecified, uncomplicated: Secondary | ICD-10-CM

## 2020-01-05 DIAGNOSIS — K7200 Acute and subacute hepatic failure without coma: Secondary | ICD-10-CM | POA: Diagnosis present

## 2020-01-05 DIAGNOSIS — F319 Bipolar disorder, unspecified: Secondary | ICD-10-CM | POA: Diagnosis present

## 2020-01-05 DIAGNOSIS — Z0189 Encounter for other specified special examinations: Secondary | ICD-10-CM

## 2020-01-05 DIAGNOSIS — Z4659 Encounter for fitting and adjustment of other gastrointestinal appliance and device: Secondary | ICD-10-CM

## 2020-01-05 DIAGNOSIS — J9601 Acute respiratory failure with hypoxia: Secondary | ICD-10-CM | POA: Diagnosis not present

## 2020-01-05 LAB — URINALYSIS, ROUTINE W REFLEX MICROSCOPIC
Glucose, UA: NEGATIVE mg/dL
Hgb urine dipstick: NEGATIVE
Ketones, ur: NEGATIVE mg/dL
Leukocytes,Ua: NEGATIVE
Nitrite: NEGATIVE
Protein, ur: 100 mg/dL — AB
Specific Gravity, Urine: 1.02 (ref 1.005–1.030)
pH: 5 (ref 5.0–8.0)

## 2020-01-05 LAB — COMPREHENSIVE METABOLIC PANEL
ALT: 179 U/L — ABNORMAL HIGH (ref 0–44)
AST: 1201 U/L — ABNORMAL HIGH (ref 15–41)
Albumin: 2.7 g/dL — ABNORMAL LOW (ref 3.5–5.0)
Alkaline Phosphatase: 171 U/L — ABNORMAL HIGH (ref 38–126)
Anion gap: 16 — ABNORMAL HIGH (ref 5–15)
BUN: 9 mg/dL (ref 6–20)
CO2: 19 mmol/L — ABNORMAL LOW (ref 22–32)
Calcium: 6.8 mg/dL — ABNORMAL LOW (ref 8.9–10.3)
Chloride: 97 mmol/L — ABNORMAL LOW (ref 98–111)
Creatinine, Ser: 1.04 mg/dL (ref 0.61–1.24)
GFR calc Af Amer: >60 mL/min (ref >60)
GFR calc non Af Amer: >60 mL/min (ref >60)
Glucose, Bld: 75 mg/dL (ref 70–99)
Potassium: 4.6 mmol/L (ref 3.5–5.1)
Sodium: 132 mmol/L — ABNORMAL LOW (ref 135–145)
Total Bilirubin: 4.4 mg/dL — ABNORMAL HIGH (ref 0.3–1.2)
Total Protein: 7.1 g/dL (ref 6.5–8.1)

## 2020-01-05 LAB — CBC
HCT: 49.6 % (ref 39.0–52.0)
Hemoglobin: 16.4 g/dL (ref 13.0–17.0)
MCH: 28.9 pg (ref 26.0–34.0)
MCHC: 33.1 g/dL (ref 30.0–36.0)
MCV: 87.5 fL (ref 80.0–100.0)
Platelets: 179 10*3/uL (ref 150–400)
RBC: 5.67 MIL/uL (ref 4.22–5.81)
RDW: 13.9 % (ref 11.5–15.5)
WBC: 12 10*3/uL — ABNORMAL HIGH (ref 4.0–10.5)
nRBC: 0 % (ref 0.0–0.2)

## 2020-01-05 LAB — HEPATITIS PANEL, ACUTE
HCV Ab: NONREACTIVE
Hep A IgM: NONREACTIVE
Hep B C IgM: NONREACTIVE
Hepatitis B Surface Ag: NONREACTIVE

## 2020-01-05 LAB — CK: Total CK: 196 U/L (ref 49–397)

## 2020-01-05 LAB — LACTIC ACID, PLASMA
Lactic Acid, Venous: 6 mmol/L (ref 0.5–1.9)
Lactic Acid, Venous: 6.5 mmol/L (ref 0.5–1.9)
Lactic Acid, Venous: 6.4 mmol/L (ref 0.5–1.9)
Lactic Acid, Venous: 4.5 mmol/L (ref 0.5–1.9)

## 2020-01-05 LAB — RESPIRATORY PANEL BY RT PCR (FLU A&B, COVID)
Influenza A by PCR: NEGATIVE
Influenza B by PCR: NEGATIVE
SARS Coronavirus 2 by RT PCR: NEGATIVE

## 2020-01-05 LAB — PROTIME-INR
INR: 1.1 (ref 0.8–1.2)
Prothrombin Time: 13.8 seconds (ref 11.4–15.2)

## 2020-01-05 LAB — ETHANOL: Alcohol, Ethyl (B): 104 mg/dL — ABNORMAL HIGH (ref <10)

## 2020-01-05 LAB — LIPASE, BLOOD: Lipase: 1499 U/L — ABNORMAL HIGH (ref 11–51)

## 2020-01-05 LAB — APTT: aPTT: 31 seconds (ref 24–36)

## 2020-01-05 MED ORDER — MORPHINE SULFATE (PF) 4 MG/ML IV SOLN
4.0000 mg | Freq: Once | INTRAVENOUS | Status: AC
  Administered 2020-01-05: 4 mg via INTRAVENOUS
  Filled 2020-01-05: qty 1

## 2020-01-05 MED ORDER — SODIUM CHLORIDE 0.9 % IV BOLUS
1000.0000 mL | Freq: Once | INTRAVENOUS | Status: AC
  Administered 2020-01-05: 1000 mL via INTRAVENOUS

## 2020-01-05 MED ORDER — MORPHINE SULFATE (PF) 4 MG/ML IV SOLN
4.0000 mg | Freq: Once | INTRAVENOUS | Status: AC
  Administered 2020-01-05: 12:00:00 4 mg via INTRAVENOUS
  Filled 2020-01-05: qty 1

## 2020-01-05 MED ORDER — HYDROMORPHONE HCL 1 MG/ML IJ SOLN: 0.5000 mg | INTRAMUSCULAR | Status: DC | PRN

## 2020-01-05 MED ORDER — HYDROMORPHONE HCL 1 MG/ML IJ SOLN
1.0000 mg | Freq: Once | INTRAMUSCULAR | Status: AC
  Administered 2020-01-05: 1 mg via INTRAVENOUS
  Filled 2020-01-05: qty 1

## 2020-01-05 MED ORDER — ADULT MULTIVITAMIN W/MINERALS CH
1.0000 | ORAL_TABLET | Freq: Every day | ORAL | Status: DC
  Administered 2020-01-05 – 2020-01-06 (×2): 1 via ORAL
  Filled 2020-01-05 (×2): qty 1

## 2020-01-05 MED ORDER — ONDANSETRON HCL 4 MG/2ML IJ SOLN
4.0000 mg | Freq: Once | INTRAMUSCULAR | Status: AC
  Administered 2020-01-05: 4 mg via INTRAVENOUS
  Filled 2020-01-05: qty 2

## 2020-01-05 MED ORDER — MORPHINE SULFATE (PF) 2 MG/ML IV SOLN: 1.0000 mg | INTRAVENOUS | Status: DC | PRN

## 2020-01-05 MED ORDER — SODIUM CHLORIDE 0.9% FLUSH
3.0000 mL | Freq: Once | INTRAVENOUS | Status: AC
  Administered 2020-01-05: 12:00:00 3 mL via INTRAVENOUS

## 2020-01-05 MED ORDER — LORAZEPAM 2 MG/ML IJ SOLN
1.0000 mg | INTRAMUSCULAR | Status: DC | PRN
  Administered 2020-01-05: 20:00:00 2 mg via INTRAVENOUS
  Administered 2020-01-06: 02:00:00 1 mg via INTRAVENOUS
  Administered 2020-01-06 (×3): 2 mg via INTRAVENOUS
  Administered 2020-01-06: 1 mg via INTRAVENOUS
  Filled 2020-01-05 (×6): qty 1

## 2020-01-05 MED ORDER — THIAMINE HCL 100 MG PO TABS
100.0000 mg | ORAL_TABLET | Freq: Every day | ORAL | Status: DC
  Administered 2020-01-06: 09:00:00 100 mg via ORAL
  Filled 2020-01-05: qty 1

## 2020-01-05 MED ORDER — IOHEXOL 350 MG/ML SOLN
100.0000 mL | Freq: Once | INTRAVENOUS | Status: AC | PRN
  Administered 2020-01-05: 100 mL via INTRAVENOUS

## 2020-01-05 MED ORDER — HEPARIN BOLUS VIA INFUSION
4500.0000 [IU] | Freq: Once | INTRAVENOUS | Status: AC
  Administered 2020-01-05: 21:00:00 4500 [IU] via INTRAVENOUS
  Filled 2020-01-05: qty 4500

## 2020-01-05 MED ORDER — FENTANYL CITRATE (PF) 100 MCG/2ML IJ SOLN
50.0000 ug | Freq: Once | INTRAMUSCULAR | Status: AC
  Administered 2020-01-05: 50 ug via INTRAVENOUS
  Filled 2020-01-05: qty 2

## 2020-01-05 MED ORDER — LORAZEPAM 1 MG PO TABS: 1.0000 mg | ORAL_TABLET | ORAL | Status: DC | PRN

## 2020-01-05 MED ORDER — SODIUM CHLORIDE 0.9 % IV SOLN
INTRAVENOUS | Status: DC
  Administered 2020-01-05: 1000 mL via INTRAVENOUS

## 2020-01-05 MED ORDER — FOLIC ACID 1 MG PO TABS
1.0000 mg | ORAL_TABLET | Freq: Every day | ORAL | Status: DC
  Administered 2020-01-05 – 2020-01-06 (×2): 1 mg via ORAL
  Filled 2020-01-05: qty 1

## 2020-01-05 MED ORDER — HEPARIN (PORCINE) 25000 UT/250ML-% IV SOLN
1300.0000 [IU]/h | INTRAVENOUS | Status: DC
  Administered 2020-01-05 – 2020-01-06 (×2): 1300 [IU]/h via INTRAVENOUS
  Filled 2020-01-05 (×2): qty 250

## 2020-01-05 MED ORDER — THIAMINE HCL 100 MG/ML IJ SOLN
100.0000 mg | Freq: Every day | INTRAMUSCULAR | Status: DC
  Administered 2020-01-05: 20:00:00 100 mg via INTRAVENOUS
  Filled 2020-01-05 (×2): qty 2

## 2020-01-05 NOTE — ED Provider Notes (Signed)
[facility] EMERGENCY DEPARTMENT Provider Note   CSN: [id number] Arrival date & time: 12/15/2019  1051     History No chief complaint on file.   Jack Fernandez is a 54 y.o. male.  HPI  Patient is a 54 year old male with history of homelessness living in shelter, anxiety, bipolar, depression, hypertension, bilateral hand neuropathy, alcoholic hepatitis, palpitations  Patient presents today from [employer] with complaints of lightheadedness and dizziness and abdominal pain which came on this morning.  Patient describes the pain as  severe, sharp and stabbing.  10/10.  States his worst pain is ever had.  He denies any diarrhea.  Endorses multiple episodes of vomiting which she states is nonbloody nonbilious.   Patient states that he drank 2 glasses of wine last night and 1 glass of wine this morning and states that he had abdominal pain that began after he finished drinking.  He states that he has felt dizzy, lightheadedness, fatigue, feels very anxious and has severe abdominal pain.  He states he has no history of pancreatitis in the past.  He states he has a history of alcoholic hepatitis however.    Past Medical History:  Diagnosis Date  . Alcoholic hepatitis without ascites   . Alcoholism ([place])   . Ambulates with cane   . Anxiety   . Bipolar disorder ([place])   . Depression   . Hypertension   . Memory loss    mild per patient  . Neuromuscular disorder ([place])    bilateral hands- neuropathy   . Palpitations     Patient Active Problem List   Diagnosis Date Noted  . Pancreatitis 12/25/2019  . Acute cystitis without hematuria 12/16/2019  . Gross hematuria 12/15/2019  . H/O cervical spine surgery 11/13/2019  . Depression 10/13/2019  . Myelopathy ([place]) 10/13/2019  . Homelessness 09/24/2019  . Cervical stenosis of spine 09/23/2019  . Spinal stenosis of cervical region 09/02/2019  . Other spondylosis with myelopathy, cervical region 09/02/2019  .  Spinal stenosis of lumbar region 09/02/2019  . HTN (hypertension) 07/15/2019  . Neuropathy 06/09/2019  . Alcohol use 06/09/2019    Past Surgical History:  Procedure Laterality Date  . ANTERIOR CERVICAL DECOMP/DISCECTOMY FUSION N/A 09/23/2019   Procedure: CERVICAL THREE-FOUR,CERVICAL FOUR-FIVE  , CERVICAL FIVE-SIX  ANTERIOR CERVICAL DECOMPRESSION/DISCECTOMY FUSION, ALLOGRAFT, PLATES, FUSION CERVICAL THREE-CERVICAL SIX;  Surgeon: Marybelle Killings, MD;  Location: [facility];  Service: Orthopedics;  Laterality: N/A;  . FINGER SURGERY     pinky - repair tendon - right hand       Family History  Problem Relation Age of Onset  . HIV/AIDS Father     Social History   Tobacco Use  . Smoking status: Never Smoker  . Smokeless tobacco: Never Used  Substance Use Topics  . Alcohol use: Yes    Comment: 3 16oz beers and 1 pint of wine daily  . Drug use: No    Home Medications Prior to Admission medications   Medication Sig Start Date End Date Taking? Authorizing Provider  amLODipine (NORVASC) 5 MG tablet Take 1 tablet (5 mg total) by mouth daily. 12/03/19  Yes Elsie Stain, MD  gabapentin (NEURONTIN) 300 MG capsule Take 2 capsules (600 mg total) by mouth 4 (four) times daily. 12/03/19 01/01/2020 Yes Elsie Stain, MD  loratadine (CLARITIN) 10 MG tablet Take 1 tablet (10 mg total) by mouth daily. 12/03/19  Yes Elsie Stain, MD  PARoxetine (PAXIL) 20 MG tablet Take 1 tablet (20 mg  total) by mouth daily. Patient not taking: Reported on 12/29/2019 12/15/19   Elsie Stain, MD    Allergies    Amoxicillin  Review of Systems   Review of Systems  Constitutional: Negative for chills and fever.  HENT: Negative for congestion.   Eyes: Negative for pain.  Respiratory: Negative for cough and shortness of breath.   Cardiovascular: Negative for chest pain and leg swelling.  Gastrointestinal: Positive for abdominal pain, nausea and vomiting. Negative for anal bleeding and blood in stool.    Genitourinary: Negative for discharge, dysuria, frequency and hematuria.  Musculoskeletal: Negative for myalgias.  Skin: Negative for rash.  Neurological: Negative for dizziness and headaches.    Physical Exam Updated Vital Signs BP 98/64   Pulse (!) 139   Temp 98.1 F (36.7 C)   Resp (!) 31   Ht 5' 9"  (1.753 m)   Wt 90.7 kg   SpO2 (!) 80%   BMI 29.53 kg/m   Physical Exam Vitals and nursing note reviewed.  Constitutional:      General: He is in acute distress.     Comments: Patient is 54 year old gentleman in acute distress, flailing and writhing in bed secondary to pain.  HENT:     Head: Normocephalic and atraumatic.     Nose: Nose normal.     Mouth/Throat:     Mouth: Mucous membranes are dry.  Eyes:     General: No scleral icterus. Cardiovascular:     Rate and Rhythm: Regular rhythm. Tachycardia present.     Pulses: Normal pulses.     Heart sounds: Normal heart sounds.     Comments: Patient is tachycardic at 120 Pulmonary:     Effort: Pulmonary effort is normal. No respiratory distress.     Breath sounds: No wheezing.  Abdominal:     Palpations: Abdomen is soft.     Tenderness: There is abdominal tenderness. There is no right CVA tenderness, left CVA tenderness, guarding or rebound.     Comments: She has mild tenderness to palpation of the periumbilical and epigastric region.  Pain seems to be at proportion to exam.  No rebound or guarding.  No CVA tenderness.  No notable abdominal distention.  Musculoskeletal:     Cervical back: Normal range of motion.     Right lower leg: No edema.     Left lower leg: No edema.  Skin:    General: Skin is warm and dry.     Capillary Refill: Capillary refill takes less than 2 seconds.  Neurological:     Mental Status: He is alert. Mental status is at baseline.  Psychiatric:        Mood and Affect: Mood normal.        Behavior: Behavior normal.     ED Results / Procedures / Treatments   Labs (all labs ordered are listed,  but only abnormal results are displayed) Labs Reviewed  CBC - Abnormal; Notable for the following components:      Result Value   WBC 12.0 (*)    All other components within normal limits  URINALYSIS, ROUTINE W REFLEX MICROSCOPIC - Abnormal; Notable for the following components:   Color, Urine AMBER (*)    APPearance CLOUDY (*)    Bilirubin Urine SMALL (*)    Protein, ur 100 (*)    Bacteria, UA RARE (*)    All other components within normal limits  LACTIC ACID, PLASMA - Abnormal; Notable for the following components:   Lactic Acid, Venous  6.5 (*)    All other components within normal limits  LACTIC ACID, PLASMA - Abnormal; Notable for the following components:   Lactic Acid, Venous 6.0 (*)    All other components within normal limits  COMPREHENSIVE METABOLIC PANEL - Abnormal; Notable for the following components:   Sodium 132 (*)    Chloride 97 (*)    CO2 19 (*)    Calcium 6.8 (*)    Albumin 2.7 (*)    AST 1,201 (*)    ALT 179 (*)    Alkaline Phosphatase 171 (*)    Total Bilirubin 4.4 (*)    Anion gap 16 (*)    All other components within normal limits  LIPASE, BLOOD - Abnormal; Notable for the following components:   Lipase 1,499 (*)    All other components within normal limits  LACTIC ACID, PLASMA - Abnormal; Notable for the following components:   Lactic Acid, Venous 6.4 (*)    All other components within normal limits  RESPIRATORY PANEL BY RT PCR (FLU A&B, COVID)  CK  HEPATITIS PANEL, ACUTE  PROTIME-INR  APTT  COMPREHENSIVE METABOLIC PANEL  CBC  LIPASE, BLOOD  RAPID URINE DRUG SCREEN, HOSP PERFORMED  ETHANOL  CBG MONITORING, ED    EKG EKG Interpretation  Date/Time:  Tuesday January 05 2020 11:07:11 EDT Ventricular Rate:  126 PR Interval:    QRS Duration: 73 QT Interval:  320 QTC Calculation: 464 R Axis:   78 Text Interpretation: Sinus tachycardia Atrial premature complex No previous tracing Confirmed by Blanchie Dessert [phone]) on 12/25/2019 11:26:12  AM   Radiology DG Chest Port 1 View  Result Date: 12/28/2019 CLINICAL DATA:  Pain EXAM: PORTABLE CHEST 1 VIEW COMPARISON:  Chest radiograph September 16, 2019; chest CT September 24, 2019 FINDINGS: There is mild bibasilar atelectatic change. There is no edema or airspace opacity. Heart size and pulmonary vascularity are normal. Right-sided osteophytes in the thoracic spine again noted. There is postoperative change in the lower cervical region. IMPRESSION: Mild bibasilar atelectasis. Lungs otherwise clear. Stable cardiac silhouette. Electronically Signed   By: Lowella Grip III M.D.   On: 01/04/2020 13:59   CT Angio Abd/Pel W and/or Wo Contrast  Result Date: 01/01/2020 CLINICAL DATA:  Abdominal pain.  History of alcohol use. EXAM: CTA ABDOMEN AND PELVIS WITH CONTRAST TECHNIQUE: Multidetector CT imaging of the abdomen and pelvis was performed using the standard protocol during bolus administration of intravenous contrast. Arterial and venous phase images obtained. Multiplanar reconstructed images and MIPs were obtained and reviewed to evaluate the vascular anatomy. CONTRAST:  169m OMNIPAQUE IOHEXOL 350 MG/ML SOLN COMPARISON:  None. FINDINGS: VASCULAR Aorta: No abdominal aortic aneurysm or dissection. Abdominal aorta widely patent. Celiac: Celiac artery and its branches are widely patent. No aneurysm or dissection involving these vessels. SMA: Superior mesenteric artery and its branches are widely patent. No aneurysm or dissection involving these vessels. Renals: There is a single renal artery on each side. Each renal artery and its branches are widely patent. No aneurysm or dissection. No evident fibromuscular dysplasia. IMA: Inferior mesenteric artery is somewhat diminutive but patent. No aneurysm or dissection involving this vessel or its branches. Inflow: There are occasional foci of calcification in each proximal hypogastric artery. No hemodynamically significant obstruction noted in pelvic arterial  vessels. No aneurysm or dissection involving these vessels. Proximal Outflow: Bilateral common femoral and visualized portions of the superficial and profunda femoral arteries are patent without evidence of aneurysm, dissection, vasculitis or significant stenosis. Veins: There is a  focal thrombus in the proximal main portal vein, incompletely obstructing. No other venous defects evident. Review of the MIP images confirms the above findings. NON-VASCULAR Lower chest: Atelectatic change noted in each lower lung region. Lung bases otherwise clear. Hepatobiliary: There is diffuse hepatic steatosis. No focal liver lesions are appreciable. Gallbladder is borderline distended without wall thickening. There is no biliary duct dilatation. Pancreas: There is enhancement of the pancreatic head and uncinate process. There is loss of enhancement of the remainder of the pancreas consistent with extensive pancreatic necrosis. There is diffuse pancreatic edema consistent with acute pancreatitis. No well-defined mass or pseudocyst evident. No pancreatic duct dilatation. There is extensive fluid surrounding the pancreas with fluid tracking from the peripancreatic region on the left posteriorly to surround the posterior spleen. Fluid tracks anteriorly to partially surround the stomach. Fluid tracks to the right surrounding the distal stomach and proximal duodenum. Fluid tracks more laterally to abut the liver. Fluid tracks more inferiorly, surrounding the third portion of the duodenum and tracking into the right mid to lower abdomen. These are all changes indicative of acute pancreatitis. A small amount of fluid is noted in the dependent portion of the pelvis as well. Spleen: No splenic lesions are evident. Adrenals/Urinary Tract: Adrenals bilaterally appear unremarkable. Kidneys bilaterally show no evident mass or hydronephrosis. No renal or ureteral calculus. Urinary bladder wall thickness normal. Stomach/Bowel: There is wall  thickening involving portions of the distal stomach and most of the duodenum consistent with nearby pancreatitis. There are loops of wall thickening involving jejunum which in part may be due to the pancreatitis. A degree of underlying enteritis may also be present. No evident bowel obstruction. The terminal ileum appears normal. There is no evident free air or portal venous air. No intramural air noted. Lymphatic: No appreciable adenopathy evident in the abdomen or pelvis. Reproductive: Prostate and seminal vesicles are normal in size and contour. No evident pelvic mass. Other: Appendix appears normal. No abscess in the abdomen or pelvis. There is fat in each inguinal ring. Areas of ascites, primarily loculated, and largely if not entirely due to the extensive pancreatitis. Musculoskeletal: There is degenerative change in the lumbar spine. There is congenital narrowing throughout essentially the entire lumbar spine. There is anterior wedging of the L2 vertebral body. No blastic or lytic bone lesions. No abdominal wall or intramuscular lesions are evident. IMPRESSION: VASCULAR 1. No aneurysm or dissection involving the aorta, major mesenteric, or major pelvic arterial vessels. 2. Mild atherosclerotic plaque in each hypogastric artery. No other appreciable atherosclerosis. No fibromuscular dysplasia. NON-VASCULAR 1. Extensive acute pancreatitis with extensive peripancreatic fluid. Fluid tracks from the pancreas throughout the upper abdomen and to a lesser extent into the lower abdomen and pelvis,, primarily on the right side. 2. Extensive pancreatic necrosis with lack of enhancement of the body and tail the pancreas. Most of the head of the pancreas in the uncinate process of the pancreas show enhancement. There is diffuse pancreatic edema. No pancreatic mass or pancreatic duct dilatation. 3. 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 This finding is best appreciated on axial  slice 37 series 11; coronal slice 83 series 14, and sagittal slices 91, 92, and 93 series 15. No other thrombus evident. 4. Wall thickening in multiple loops of small bowel, likely due to pancreatitis with potential degree of enteritis superimposed. No bowel obstruction. No evidence of bowel ischemia. No free air or portal venous air. 5.  Congenital narrowing throughout the lumbar region. 6.  Diffuse hepatic steatosis. Electronically Signed   By: Lowella Grip III M.D.   On: 12/17/2019 17:51    Procedures .Critical Care Performed by: Tedd Sias, PA Authorized by: Tedd Sias, PA   Critical care provider statement:    Critical care time (minutes):  65   Critical care time was exclusive of:  Separately billable procedures and treating other patients and teaching time   Critical care was necessary to treat or prevent imminent or life-threatening deterioration of the following conditions:  Shock (SIRS + pancreatitis)   Critical care was time spent personally by me on the following activities:  Discussions with consultants, evaluation of patient's response to treatment, examination of patient, review of old charts, re-evaluation of patient's condition, pulse oximetry, ordering and review of radiographic studies, ordering and review of laboratory studies and ordering and performing treatments and interventions   I assumed direction of critical care for this patient from another provider in my specialty: no   Comments:     Patient required frequent every 30 minutes interval checks for his hypertension and tachycardia.  He required numerous doses of pain medication and IV fluids to maintain blood pressure.    (including critical care time)  Medications Ordered in ED Medications  LORazepam (ATIVAN) tablet 1-4 mg (has no administration in time range)    Or  LORazepam (ATIVAN) injection 1-4 mg (has no administration in time range)  thiamine tablet 100 mg (has no administration in time  range)    Or  thiamine (B-1) injection 100 mg (has no administration in time range)  folic acid (FOLVITE) tablet 1 mg (has no administration in time range)  multivitamin with minerals tablet 1 tablet (has no administration in time range)  0.9 %  sodium chloride infusion (has no administration in time range)  morphine 2 MG/ML injection 1 mg (has no administration in time range)  HYDROmorphone (DILAUDID) injection 0.5 mg (has no administration in time range)  sodium chloride flush (NS) 0.9 % injection 3 mL (3 mLs Intravenous Given 12/13/2019 1142)  morphine 4 MG/ML injection 4 mg (4 mg Intravenous Given 01/02/2020 1134)  ondansetron (ZOFRAN) injection 4 mg (4 mg Intravenous Given 12/17/2019 1134)  sodium chloride 0.9 % bolus 1,000 mL (0 mLs Intravenous Stopped 12/17/2019 1254)  morphine 4 MG/ML injection 4 mg (4 mg Intravenous Given 12/12/2019 1333)  sodium chloride 0.9 % bolus 1,000 mL (0 mLs Intravenous Stopped 12/21/2019 1701)  fentaNYL (SUBLIMAZE) injection 50 mcg (50 mcg Intravenous Given 12/16/2019 1559)  iohexol (OMNIPAQUE) 350 MG/ML injection 100 mL (100 mLs Intravenous Contrast Given 12/28/2019 1655)  HYDROmorphone (DILAUDID) injection 1 mg (1 mg Intravenous Given 12/12/2019 1838)    ED Course  I have reviewed the triage vital signs and the nursing notes.  Pertinent labs & imaging results that were available during my care of the patient were reviewed by me and considered in my medical decision making (see chart for details).    Clinical Course as of Jan 05 1920  Tue Jan 05, 2020  1610 Discussed with hospitalist Dr. Ileene Musa who will admit patient to hospitalist service.    [WF]  1916 Lipase is 1500.  Significantly elevated concern for pancreatitis given patient's history and lipase.  Patient with CMP notable for mild hypokalemia 132 he is receiving multiple boluses of normal saline this will likely correct.  CO2 mildly low at 19 likely secondary to patient hyperventilating secondary to pain.  BUN and  creatinine within normal limits.  Significantly elevated AST,  ALT, alk phos and total bilirubin.  Concern for biliary pathology versus shock liver state.  CBC with mild leukocytosis of 12.  Urinalysis with amber-colored urine is cloudy with small bilirubin and protein.  No evidence of infection.  Coags within normal limits.  Lactic acidosis present.  This is likely the reason for patient's anion gap of 16.  Hepatitis panel and CK obtained as patient has dark urine and evidence of liver damage.  This may be chronic however as patient does have history of alcoholic hepatitis.   [WF]  1918 Patient's blood pressures continue to be soft and abnormally low with significant tachycardia however he has received 2 L of normal saline will obtain 1 more.  Discussed with hospitalist Dr. Gilford Raid who have concern for alcohol withdrawal.  Benzodiazepine medications ordered by Dr. Flossie Buffy.    [WF]    Clinical Course User Index [WF] Tedd Sias, [place]   MDM Rules/Calculators/A&P                      Initial concern for acute mesenteric ischemia given pain in proportion and abnormal vitals with elevated lactic acid.  With lab work showing significantly elevated lipase increased concern for pancreatitis.  CT scan confirmed this.  I independently reviewed CT scan which shows the following::   IMPRESSION:  VASCULAR    1. No aneurysm or dissection involving the aorta, major mesenteric,  or major pelvic arterial vessels.    2. Mild atherosclerotic plaque in each hypogastric artery. No other  appreciable atherosclerosis. No fibromuscular dysplasia.    NON-VASCULAR    1. Extensive acute pancreatitis with extensive peripancreatic fluid.  Fluid tracks from the pancreas throughout the upper abdomen and to a  lesser extent into the lower abdomen and pelvis,, primarily on the  right side.    2. Extensive pancreatic necrosis with lack of enhancement of the  body and tail the pancreas. Most of the head of the  pancreas in the  uncinate process of the pancreas show enhancement. There is diffuse  pancreatic edema. No pancreatic mass or pancreatic duct dilatation.    3. Focal area of thrombus in the proximal main portal vein,  incompletely obstructing. This finding is best appreciated on axial  slice 37 series 11; coronal slice 83 series 14, and sagittal slices  91, 92, and 93 series 15. No other thrombus evident.    4. Wall thickening in multiple loops of small bowel, likely due to  pancreatitis with potential degree of enteritis superimposed. No  bowel obstruction. No evidence of bowel ischemia. No free air or  portal venous air.    5. Congenital narrowing throughout the lumbar region.    6. Diffuse hepatic steatosis.   Patient admitted for acute pancreatitis To Dr. Flossie Buffy. Appreciate her expert consultation and care of patient.   Final Clinical Impression(s) / ED Diagnoses Final diagnoses:  Acute pancreatitis, unspecified complication status, unspecified pancreatitis type  Tachycardia    Rx / DC Orders ED Discharge Orders    None       Tedd Sias, [place] 12/10/2019 Adria Dill, MD 01/07/20 2208

## 2020-01-05 NOTE — H&P (Addendum)
History and Physical    Jack Fernandez IFO:[id number] DOB: April 21, 1966 DOA: 12/14/2019  PCP: Storm Frisk, MD  [facility] from: [facility]  I have personally briefly reviewed patient's old medical records in [facility]  Chief Complaint: Abdominal pain  HPI: Jack Fernandez is a 54 y.o. male with medical history significant for hepatic steatosis, alcohol use, hypertension, cervical spine myopathy, peripheral neuropathy, and depression who presents with concerns of acute abdominal pain.  Patient reports drinking about 2 cups of wine this morning when he recently developed acute lower abdominal pain.  Endorses nausea, vomiting and diarrhea.  Pain is cramping in nature and is constant 8 out of 10.  He reports that he drinks about 2 to 3 cups of wine daily since "forever."  He denies any other illicit drug use.  Denies any chest pain, palpitations.  Denies any shortness of breath.  Patient is homeless and does not have a primary physician or take any prescription medications.  Denies history of any abdominal surgery.  ED Course: Temp of 98.1, tachycardic up to 140s and tachypnea on room air. WBC of 12. Na of 132, AST of 1201, ALT of 179, total bilirubin of 4.4, anion gap of 16. Lipase of 1499. Lactic acid of 6.5--> 6.  CTA angio abdomen and pelvic was obtained by ED PA since patient's pain at that time seem out of proportion to exam.  No aneurysm or dissection was seen.  There is extensive acute pancreatitis and necrosis to the body and tail of the pancreas.  There is a focal area of thrombus in the proximal main portal vein.  Review of Systems:  Constitutional: No Weight Change, No Fever ENT/Mouth: No sore throat, No Rhinorrhea Eyes: No Eye Pain, No Vision Changes Cardiovascular: No Chest Pain, no SOB Respiratory: No Cough, No Sputum,  Gastrointestinal: + Nausea, + Vomiting, + Diarrhea, No Constipation, +Pain Genitourinary: no Urinary Incontinence, Musculoskeletal:  No Arthralgias, No Myalgias Skin: No Skin Lesions, No Pruritus, Neuro: no Weakness, No Numbness Psych: No Anxiety/Panic, No Depression, no decrease appetite Heme/Lymph: No Bruising, No Bleeding  Past Medical History:  Diagnosis Date  . Alcoholic hepatitis without ascites   . Alcoholism (HCC)   . Ambulates with cane   . Anxiety   . Bipolar disorder (HCC)   . Depression   . Hypertension   . Memory loss    mild per patient  . Neuromuscular disorder (HCC)    bilateral hands- neuropathy   . Palpitations     Past Surgical History:  Procedure Laterality Date  . ANTERIOR CERVICAL DECOMP/DISCECTOMY FUSION N/A 09/23/2019   Procedure: CERVICAL THREE-FOUR,CERVICAL FOUR-FIVE  , CERVICAL FIVE-SIX  ANTERIOR CERVICAL DECOMPRESSION/DISCECTOMY FUSION, ALLOGRAFT, PLATES, FUSION CERVICAL THREE-CERVICAL SIX;  Surgeon: Eldred Manges, MD;  Location: [facility] OR;  Service: Orthopedics;  Laterality: N/A;  . FINGER SURGERY     pinky - repair tendon - right hand     reports that he has never smoked. He has never used smokeless tobacco. He reports current alcohol use. He reports that he does not use drugs.  Allergies  Allergen Reactions  . Amoxicillin Diarrhea    Family History  Problem Relation Age of Onset  . HIV/AIDS Father      Prior to Admission medications   Medication Sig Start Date End Date Taking? Authorizing Provider  amLODipine (NORVASC) 5 MG tablet Take 1 tablet (5 mg total) by mouth daily. 12/03/19  Yes Storm Frisk, MD  gabapentin (NEURONTIN) 300 MG capsule Take 2  capsules (600 mg total) by mouth 4 (four) times daily. 12/03/19 01/19/20 Yes Storm FriskWright, Patrick E, MD  loratadine (CLARITIN) 10 MG tablet Take 1 tablet (10 mg total) by mouth daily. 12/03/19  Yes Storm FriskWright, Patrick E, MD  PARoxetine (PAXIL) 20 MG tablet Take 1 tablet (20 mg total) by mouth daily. Patient not taking: Reported on 11/17/2019 12/15/19   Storm FriskWright, Patrick E, MD    Physical Exam: Vitals:   005/11/21 1645 005/11/21 1730  005/11/21 1745 005/11/21 1912  BP: 125/71 120/89 105/85 98/64  Pulse:  (!) 51  (!) 139  Resp: 19 (!) 39 (!) 31   Temp:      TempSrc:      SpO2:  (!) 80%    Weight:      Height:        Constitutional: NAD, calm, nontoxic appearing male laying on his left side with frequent curling up of his body during abdominal pain Vitals:   005/11/21 1645 005/11/21 1730 005/11/21 1745 005/11/21 1912  BP: 125/71 120/89 105/85 98/64  Pulse:  (!) 51  (!) 139  Resp: 19 (!) 39 (!) 31   Temp:      TempSrc:      SpO2:  (!) 80%    Weight:      Height:       Eyes: PERRL, lids and conjunctivae normal ENMT: Mucous membranes are moist.  Neck: normal, supple Respiratory: clear to auscultation bilaterally, no wheezing, no crackles. Normal respiratory effort on room air. No accessory muscle use.  Cardiovascular: Sinus tachycardia, no murmurs / rubs / gallops. No extremity edema. 2+ pedal pulses.  Abdomen: Diffuse abdominal tenderness worse at the epigastric area with moderate distention, no masses palpated.Bowel sounds positive.  Musculoskeletal: no clubbing / cyanosis. No joint deformity upper and lower extremities. Good ROM, no contractures. Normal muscle tone.  Skin: no rashes, lesions, ulcers. No induration Neurologic: CN 2-12 grossly intact. Sensation intact, Strength 5/5 in all 4.  Psychiatric: Normal judgment and insight. Alert and oriented x 3.  Flat affect.   Labs on Admission: I have personally reviewed following labs and imaging studies  CBC: Recent Labs  Lab 005/11/21 1102  WBC 12.0*  HGB 16.4  HCT 49.6  MCV 87.5  PLT 179   Basic Metabolic Panel: Recent Labs  Lab 005/11/21 1315  NA 132*  K 4.6  CL 97*  CO2 19*  GLUCOSE 75  BUN 9  CREATININE 1.04  CALCIUM 6.8*   GFR: Estimated Creatinine Clearance: 88.3 mL/min (by C-G formula based on SCr of 1.04 mg/dL). Liver Function Tests: Recent Labs  Lab 005/11/21 1315  AST 1,201*  ALT 179*  ALKPHOS 171*  BILITOT 4.4*  PROT 7.1    ALBUMIN 2.7*   Recent Labs  Lab 005/11/21 1315  LIPASE 1,499*   No results for input(s): AMMONIA in the last 168 hours. Coagulation Profile: Recent Labs  Lab 005/11/21 1533  INR 1.1   Cardiac Enzymes: Recent Labs  Lab 005/11/21 1533  CKTOTAL 196   BNP (last 3 results) No results for input(s): PROBNP in the last 8760 hours. HbA1C: No results for input(s): HGBA1C in the last 72 hours. CBG: No results for input(s): GLUCAP in the last 168 hours. Lipid Profile: No results for input(s): CHOL, HDL, LDLCALC, TRIG, CHOLHDL, LDLDIRECT in the last 72 hours. Thyroid Function Tests: No results for input(s): TSH, T4TOTAL, FREET4, T3FREE, THYROIDAB in the last 72 hours. Anemia Panel: No results for input(s): VITAMINB12, FOLATE, FERRITIN, TIBC, IRON, RETICCTPCT in the  last 72 hours. Urine analysis:    Component Value Date/Time   COLORURINE AMBER (A) 12/12/2019 1355   APPEARANCEUR CLOUDY (A) 12/19/2019 1355   APPEARANCEUR Clear 12/15/2019 1414   LABSPEC 1.020 01/03/2020 1355   PHURINE 5.0 12/18/2019 1355   GLUCOSEU NEGATIVE 12/25/2019 1355   HGBUR NEGATIVE 12/11/2019 1355   BILIRUBINUR SMALL (A) 12/25/2019 1355   BILIRUBINUR Negative 12/15/2019 1414   KETONESUR NEGATIVE 01/04/2020 1355   PROTEINUR 100 (A) 12/24/2019 1355   UROBILINOGEN 1.0 04/26/2015 2014   NITRITE NEGATIVE 12/31/2019 1355   LEUKOCYTESUR NEGATIVE 12/16/2019 1355    Radiological Exams on Admission: DG Chest Port 1 View  Result Date: 12/10/2019 CLINICAL DATA:  Pain EXAM: PORTABLE CHEST 1 VIEW COMPARISON:  Chest radiograph September 16, 2019; chest CT September 24, 2019 FINDINGS: There is mild bibasilar atelectatic change. There is no edema or airspace opacity. Heart size and pulmonary vascularity are normal. Right-sided osteophytes in the thoracic spine again noted. There is postoperative change in the lower cervical region. IMPRESSION: Mild bibasilar atelectasis. Lungs otherwise clear. Stable cardiac silhouette.  Electronically Signed   By: Bretta Bang III M.D.   On: 12/20/2019 13:59   CT Angio Abd/Pel W and/or Wo Contrast  Result Date: 12/10/2019 CLINICAL DATA:  Abdominal pain.  History of alcohol use. EXAM: CTA ABDOMEN AND PELVIS WITH CONTRAST TECHNIQUE: Multidetector CT imaging of the abdomen and pelvis was performed using the standard protocol during bolus administration of intravenous contrast. Arterial and venous phase images obtained. Multiplanar reconstructed images and MIPs were obtained and reviewed to evaluate the vascular anatomy. CONTRAST:  OMNIPAQUE IOHEXOL 350 MG/ML SOLN COMPARISON:  None. FINDINGS: VASCULAR Aorta: No abdominal aortic aneurysm or dissection. Abdominal aorta widely patent. Celiac: Celiac artery and its branches are widely patent. No aneurysm or dissection involving these vessels. SMA: Superior mesenteric artery and its branches are widely patent. No aneurysm or dissection involving these vessels. Renals: There is a single renal artery on each side. Each renal artery and its branches are widely patent. No aneurysm or dissection. No evident fibromuscular dysplasia. IMA: Inferior mesenteric artery is somewhat diminutive but patent. No aneurysm or dissection involving this vessel or its branches. Inflow: There are occasional foci of calcification in each proximal hypogastric artery. No hemodynamically significant obstruction noted in pelvic arterial vessels. No aneurysm or dissection involving these vessels. Proximal Outflow: Bilateral common femoral and visualized portions of the superficial and profunda femoral arteries are patent without evidence of aneurysm, dissection, vasculitis or significant stenosis. Veins: There is a focal thrombus in the proximal main portal vein, incompletely obstructing. No other venous defects evident. Review of the MIP images confirms the above findings. NON-VASCULAR Lower chest: Atelectatic change noted in each lower lung region. Lung bases otherwise  clear. Hepatobiliary: There is diffuse hepatic steatosis. No focal liver lesions are appreciable. Gallbladder is borderline distended without wall thickening. There is no biliary duct dilatation. Pancreas: There is enhancement of the pancreatic head and uncinate process. There is loss of enhancement of the remainder of the pancreas consistent with extensive pancreatic necrosis. There is diffuse pancreatic edema consistent with acute pancreatitis. No well-defined mass or pseudocyst evident. No pancreatic duct dilatation. There is extensive fluid surrounding the pancreas with fluid tracking from the peripancreatic region on the left posteriorly to surround the posterior spleen. Fluid tracks anteriorly to partially surround the stomach. Fluid tracks to the right surrounding the distal stomach and proximal duodenum. Fluid tracks more laterally to abut the liver. Fluid tracks more inferiorly, surrounding the  third portion of the duodenum and tracking into the right mid to lower abdomen. These are all changes indicative of acute pancreatitis. A small amount of fluid is noted in the dependent portion of the pelvis as well. Spleen: No splenic lesions are evident. Adrenals/Urinary Tract: Adrenals bilaterally appear unremarkable. Kidneys bilaterally show no evident mass or hydronephrosis. No renal or ureteral calculus. Urinary bladder wall thickness normal. Stomach/Bowel: There is wall thickening involving portions of the distal stomach and most of the duodenum consistent with nearby pancreatitis. There are loops of wall thickening involving jejunum which in part may be due to the pancreatitis. A degree of underlying enteritis may also be present. No evident bowel obstruction. The terminal ileum appears normal. There is no evident free air or portal venous air. No intramural air noted. Lymphatic: No appreciable adenopathy evident in the abdomen or pelvis. Reproductive: Prostate and seminal vesicles are normal in size and  contour. No evident pelvic mass. Other: Appendix appears normal. No abscess in the abdomen or pelvis. There is fat in each inguinal ring. Areas of ascites, primarily loculated, and largely if not entirely due to the extensive pancreatitis. Musculoskeletal: There is degenerative change in the lumbar spine. There is congenital narrowing throughout essentially the entire lumbar spine. There is anterior wedging of the L2 vertebral body. No blastic or lytic bone lesions. No abdominal wall or intramuscular lesions are evident. IMPRESSION: VASCULAR 1. No aneurysm or dissection involving the aorta, major mesenteric, or major pelvic arterial vessels. 2. Mild atherosclerotic plaque in each hypogastric artery. No other appreciable atherosclerosis. No fibromuscular dysplasia. NON-VASCULAR 1. Extensive acute pancreatitis with extensive peripancreatic fluid. Fluid tracks from the pancreas throughout the upper abdomen and to a lesser extent into the lower abdomen and pelvis,, primarily on the right side. 2. Extensive pancreatic necrosis with lack of enhancement of the body and tail the pancreas. Most of the head of the pancreas in the uncinate process of the pancreas show enhancement. There is diffuse pancreatic edema. No pancreatic mass or pancreatic duct dilatation. 3. 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 This finding is best appreciated on axial slice 37 series 11; coronal slice 83 series 14, and sagittal slices 91, 92, and 93 series 15. No other thrombus evident. 4. Wall thickening in multiple loops of small bowel, likely due to pancreatitis with potential degree of enteritis superimposed. No bowel obstruction. No evidence of bowel ischemia. No free air or portal venous air. 5.  Congenital narrowing throughout the lumbar region. 6.  Diffuse hepatic steatosis. Electronically Signed   By: Bretta Bang III M.D.   On: 12/11/2019 17:51    EKG: Independently reviewed.    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 CXR and UA negative. CT abdomen did show possible enteritis Will start broad spectrum antibiotics for now with Vanc/cefepime and Flagy and get blood cultures  Hypotension Could be due to pain medication received in ER Monitor closely with fluids  Portal vein thrombosis Start IV heparin infusion TOC consult for medication assistance at discharge  Alcohol use Patient does not appear to be forthcoming about his actual daily alcohol use We will place on CIWA protocol as he appears to be significantly tachycardic out of proportion with his pain  Sinus tachycardia We will admit to telemetry Likely due to acute pancreatitis and pain but questionable whether  he is undergoing withdrawal Will also check ethanol level and UDS  Acute transaminitis/elevated total bilirubin AST of 1201 and ALT of 179-likely from heavy alcohol use We will repeat CMP following aggressive fluid management  Lactic acidosis Secondary to acute pancreatitis Trend lactate with aggressive fluids  DVT prophylaxis: Heparin infusion Code Status: Full Family Communication: Plan discussed with patient at bedside  disposition Plan: Home with at least 2 midnight stays  Consults called:  Admission status: inpatient Status is: Inpatient  Remains inpatient appropriate because:Persistent severe electrolyte disturbances   Dispo: The patient is from: Homeless              Anticipated d/c is to: Homeless-staying at [facility]              Anticipated d/c date is: 3 days              Patient currently is not medically stable to d/c.          Orene Desanctis DO [facility]   If 7PM-7AM, please contact night-coverage [email or web address]   12/28/2019, 7:37 PM

## 2020-01-05 NOTE — ED Notes (Signed)
Family at bedside. 

## 2020-01-05 NOTE — Progress Notes (Signed)
ANTICOAGULATION CONSULT NOTE - Initial Consult  Pharmacy Consult for Heparin Indication: portal vein thrombosis  Allergies  Allergen Reactions  . Amoxicillin Diarrhea    Patient Measurements: Height: 5\' 9"  (175.3 cm) Weight: 90.7 kg (200 lb) IBW/kg (Calculated) : 70.7 Heparin Dosing Weight: 89.1 kg  Vital Signs: Temp: 98.1 F (36.7 C) (04/27 1143) Temp Source: Oral (04/27 1055) BP: 98/64 (04/27 1912) Pulse Rate: 139 (04/27 1912)  Labs: Recent Labs    2020-01-12 1102 2020-01-12 1315 12-Jan-2020 1533  HGB 16.4  --   --   HCT 49.6  --   --   PLT 179  --   --   APTT  --   --  31  LABPROT  --   --  13.8  INR  --   --  1.1  CREATININE  --  1.04  --   CKTOTAL  --   --  196    Estimated Creatinine Clearance: 88.3 mL/min (by C-G formula based on SCr of 1.04 mg/dL).   Medical History: Past Medical History:  Diagnosis Date  . Alcoholic hepatitis without ascites   . Alcoholism (HCC)   . Ambulates with cane   . Anxiety   . Bipolar disorder (HCC)   . Depression   . Hypertension   . Memory loss    mild per patient  . Neuromuscular disorder (HCC)    bilateral hands- neuropathy   . Palpitations     Assessment: 54 yo M presents with new portal vein thrombus. No AC noted PTA. Pharmacy asked to dose heparin. CBC wnl, Scr wnl 1.04.   Goal of Therapy:  Heparin level 0.3-0.7 units/ml Monitor platelets by anticoagulation protocol: Yes   Plan:  Give heparin 4500 unit IV bolus once Start heparin infusion at 1300 units/hr Check 6-hr HL Monitor daily HL, CBC, s/sx bleeding  57, PharmD PGY1 Pharmacy Resident Phone: [phone] Jan 12, 2020  7:51 PM  Please check [email or web address] for unit-specific pharmacy phone numbers.

## 2020-01-05 NOTE — ED Triage Notes (Signed)
Patient arrived by [facility] complaining of dizziness. States that he had wine this am and HR 140, patient alert and oriented but states I think I am gonna pass out

## 2020-01-06 ENCOUNTER — Inpatient Hospital Stay (HOSPITAL_COMMUNITY): Payer: Medicaid Other

## 2020-01-06 DIAGNOSIS — K8591 Acute pancreatitis with uninfected necrosis, unspecified: Secondary | ICD-10-CM | POA: Diagnosis not present

## 2020-01-06 DIAGNOSIS — R6521 Severe sepsis with septic shock: Secondary | ICD-10-CM

## 2020-01-06 DIAGNOSIS — J9601 Acute respiratory failure with hypoxia: Secondary | ICD-10-CM

## 2020-01-06 DIAGNOSIS — E872 Acidosis: Secondary | ICD-10-CM | POA: Diagnosis not present

## 2020-01-06 DIAGNOSIS — Z9911 Dependence on respirator [ventilator] status: Secondary | ICD-10-CM | POA: Diagnosis not present

## 2020-01-06 DIAGNOSIS — A419 Sepsis, unspecified organism: Secondary | ICD-10-CM | POA: Diagnosis not present

## 2020-01-06 LAB — POCT I-STAT 7, (LYTES, BLD GAS, ICA,H+H)
Acid-base deficit: 9 mmol/L — ABNORMAL HIGH (ref 0.0–2.0)
Acid-base deficit: 14 mmol/L — ABNORMAL HIGH (ref 0.0–2.0)
Acid-base deficit: 16 mmol/L — ABNORMAL HIGH (ref 0.0–2.0)
Acid-base deficit: 12 mmol/L — ABNORMAL HIGH (ref 0.0–2.0)
Bicarbonate: 14.4 mmol/L — ABNORMAL LOW (ref 20.0–28.0)
Bicarbonate: 11 mmol/L — ABNORMAL LOW (ref 20.0–28.0)
Bicarbonate: 10.9 mmol/L — ABNORMAL LOW (ref 20.0–28.0)
Bicarbonate: 13 mmol/L — ABNORMAL LOW (ref 20.0–28.0)
Calcium, Ion: 0.61 mmol/L — CL (ref 1.15–1.40)
Calcium, Ion: 0.77 mmol/L — CL (ref 1.15–1.40)
Calcium, Ion: 2.24 mmol/L (ref 1.15–1.40)
Calcium, Ion: 1.25 mmol/L (ref 1.15–1.40)
HCT: 41 % (ref 39.0–52.0)
HCT: 39 % (ref 39.0–52.0)
HCT: 37 % — ABNORMAL LOW (ref 39.0–52.0)
HCT: 34 % — ABNORMAL LOW (ref 39.0–52.0)
Hemoglobin: 13.9 g/dL (ref 13.0–17.0)
Hemoglobin: 13.3 g/dL (ref 13.0–17.0)
Hemoglobin: 12.6 g/dL — ABNORMAL LOW (ref 13.0–17.0)
Hemoglobin: 11.6 g/dL — ABNORMAL LOW (ref 13.0–17.0)
O2 Saturation: 98 %
O2 Saturation: 99 %
O2 Saturation: 98 %
O2 Saturation: 100 %
Patient temperature: 96.7
Patient temperature: 98
Patient temperature: 97.5
Patient temperature: 98.9
Potassium: 6 mmol/L — ABNORMAL HIGH (ref 3.5–5.1)
Potassium: 5.5 mmol/L — ABNORMAL HIGH (ref 3.5–5.1)
Potassium: 4.8 mmol/L (ref 3.5–5.1)
Potassium: 4.6 mmol/L (ref 3.5–5.1)
Sodium: 132 mmol/L — ABNORMAL LOW (ref 135–145)
Sodium: 132 mmol/L — ABNORMAL LOW (ref 135–145)
Sodium: 134 mmol/L — ABNORMAL LOW (ref 135–145)
Sodium: 135 mmol/L (ref 135–145)
TCO2: 15 mmol/L — ABNORMAL LOW (ref 22–32)
TCO2: 12 mmol/L — ABNORMAL LOW (ref 22–32)
TCO2: 12 mmol/L — ABNORMAL LOW (ref 22–32)
TCO2: 14 mmol/L — ABNORMAL LOW (ref 22–32)
pCO2 arterial: 24.2 mmHg — ABNORMAL LOW (ref 32.0–48.0)
pCO2 arterial: 22.5 mmHg — ABNORMAL LOW (ref 32.0–48.0)
pCO2 arterial: 28.5 mmHg — ABNORMAL LOW (ref 32.0–48.0)
pCO2 arterial: 25.3 mmHg — ABNORMAL LOW (ref 32.0–48.0)
pH, Arterial: 7.377 (ref 7.350–7.450)
pH, Arterial: 7.295 — ABNORMAL LOW (ref 7.350–7.450)
pH, Arterial: 7.186 — CL (ref 7.350–7.450)
pH, Arterial: 7.318 — ABNORMAL LOW (ref 7.350–7.450)
pO2, Arterial: 109 mmHg — ABNORMAL HIGH (ref 83.0–108.0)
pO2, Arterial: 162 mmHg — ABNORMAL HIGH (ref 83.0–108.0)
pO2, Arterial: 134 mmHg — ABNORMAL HIGH (ref 83.0–108.0)
pO2, Arterial: 253 mmHg — ABNORMAL HIGH (ref 83.0–108.0)

## 2020-01-06 LAB — COMPREHENSIVE METABOLIC PANEL
ALT: 187 U/L — ABNORMAL HIGH (ref 0–44)
ALT: 135 U/L — ABNORMAL HIGH (ref 0–44)
AST: 1754 U/L — ABNORMAL HIGH (ref 15–41)
AST: 1263 U/L — ABNORMAL HIGH (ref 15–41)
Albumin: 2.1 g/dL — ABNORMAL LOW (ref 3.5–5.0)
Albumin: 1.8 g/dL — ABNORMAL LOW (ref 3.5–5.0)
Alkaline Phosphatase: 180 U/L — ABNORMAL HIGH (ref 38–126)
Alkaline Phosphatase: 157 U/L — ABNORMAL HIGH (ref 38–126)
Anion gap: 12 (ref 5–15)
BUN: 15 mg/dL (ref 6–20)
BUN: 19 mg/dL (ref 6–20)
CO2: <7 mmol/L — ABNORMAL LOW (ref 22–32)
CO2: 17 mmol/L — ABNORMAL LOW (ref 22–32)
Calcium: 4.8 mg/dL — CL (ref 8.9–10.3)
Calcium: 4.5 mg/dL — CL (ref 8.9–10.3)
Chloride: 104 mmol/L (ref 98–111)
Chloride: 101 mmol/L (ref 98–111)
Creatinine, Ser: 2.57 mg/dL — ABNORMAL HIGH (ref 0.61–1.24)
Creatinine, Ser: 3.84 mg/dL — ABNORMAL HIGH (ref 0.61–1.24)
GFR calc Af Amer: 31 mL/min — ABNORMAL LOW (ref >60)
GFR calc Af Amer: 19 mL/min — ABNORMAL LOW (ref >60)
GFR calc non Af Amer: 27 mL/min — ABNORMAL LOW (ref >60)
GFR calc non Af Amer: 16 mL/min — ABNORMAL LOW (ref >60)
Glucose, Bld: 186 mg/dL — ABNORMAL HIGH (ref 70–99)
Glucose, Bld: 259 mg/dL — ABNORMAL HIGH (ref 70–99)
Potassium: 5 mmol/L (ref 3.5–5.1)
Potassium: 6.4 mmol/L (ref 3.5–5.1)
Sodium: 130 mmol/L — ABNORMAL LOW (ref 135–145)
Sodium: 130 mmol/L — ABNORMAL LOW (ref 135–145)
Total Bilirubin: 8.3 mg/dL — ABNORMAL HIGH (ref 0.3–1.2)
Total Bilirubin: 7.7 mg/dL — ABNORMAL HIGH (ref 0.3–1.2)
Total Protein: 5.6 g/dL — ABNORMAL LOW (ref 6.5–8.1)
Total Protein: 5 g/dL — ABNORMAL LOW (ref 6.5–8.1)

## 2020-01-06 LAB — LIPASE, BLOOD: Lipase: 637 U/L — ABNORMAL HIGH (ref 11–51)

## 2020-01-06 LAB — BASIC METABOLIC PANEL
Anion gap: 21 — ABNORMAL HIGH (ref 5–15)
BUN: 14 mg/dL (ref 6–20)
CO2: 10 mmol/L — ABNORMAL LOW (ref 22–32)
Calcium: 10.6 mg/dL — ABNORMAL HIGH (ref 8.9–10.3)
Chloride: 108 mmol/L (ref 98–111)
Creatinine, Ser: 3.21 mg/dL — ABNORMAL HIGH (ref 0.61–1.24)
GFR calc Af Amer: 24 mL/min — ABNORMAL LOW (ref >60)
GFR calc non Af Amer: 20 mL/min — ABNORMAL LOW (ref >60)
Glucose, Bld: 41 mg/dL — CL (ref 70–99)
Potassium: 5 mmol/L (ref 3.5–5.1)
Sodium: 139 mmol/L (ref 135–145)

## 2020-01-06 LAB — LACTIC ACID, PLASMA
Lactic Acid, Venous: 4.9 mmol/L (ref 0.5–1.9)
Lactic Acid, Venous: 4 mmol/L (ref 0.5–1.9)
Lactic Acid, Venous: 8.7 mmol/L (ref 0.5–1.9)

## 2020-01-06 LAB — GLUCOSE, CAPILLARY
Glucose-Capillary: 242 mg/dL — ABNORMAL HIGH (ref 70–99)
Glucose-Capillary: 246 mg/dL — ABNORMAL HIGH (ref 70–99)
Glucose-Capillary: 77 mg/dL (ref 70–99)
Glucose-Capillary: 54 mg/dL — ABNORMAL LOW (ref 70–99)

## 2020-01-06 LAB — CBC
HCT: 42.8 % (ref 39.0–52.0)
Hemoglobin: 14.4 g/dL (ref 13.0–17.0)
MCH: 28.6 pg (ref 26.0–34.0)
MCHC: 33.6 g/dL (ref 30.0–36.0)
MCV: 84.9 fL (ref 80.0–100.0)
Platelets: DECREASED 10*3/uL (ref 150–400)
RBC: 5.04 MIL/uL (ref 4.22–5.81)
RDW: 14.5 % (ref 11.5–15.5)
WBC: 12.9 10*3/uL — ABNORMAL HIGH (ref 4.0–10.5)
nRBC: 0 % (ref 0.0–0.2)

## 2020-01-06 LAB — HEMOGLOBIN A1C
Hgb A1c MFr Bld: 5.2 % (ref 4.8–5.6)
Mean Plasma Glucose: 102.54 mg/dL

## 2020-01-06 LAB — RENAL FUNCTION PANEL
Albumin: 1.8 g/dL — ABNORMAL LOW (ref 3.5–5.0)
Anion gap: 15 (ref 5–15)
BUN: 19 mg/dL (ref 6–20)
CO2: 11 mmol/L — ABNORMAL LOW (ref 22–32)
Calcium: 4.5 mg/dL — CL (ref 8.9–10.3)
Chloride: 105 mmol/L (ref 98–111)
Creatinine, Ser: 3.74 mg/dL — ABNORMAL HIGH (ref 0.61–1.24)
GFR calc Af Amer: 20 mL/min — ABNORMAL LOW (ref >60)
GFR calc non Af Amer: 17 mL/min — ABNORMAL LOW (ref >60)
Glucose, Bld: 177 mg/dL — ABNORMAL HIGH (ref 70–99)
Phosphorus: 4.4 mg/dL (ref 2.5–4.6)
Potassium: 6.1 mmol/L — ABNORMAL HIGH (ref 3.5–5.1)
Sodium: 131 mmol/L — ABNORMAL LOW (ref 135–145)

## 2020-01-06 LAB — PHOSPHORUS: Phosphorus: 3.8 mg/dL (ref 2.5–4.6)

## 2020-01-06 LAB — HEPARIN LEVEL (UNFRACTIONATED)
Heparin Unfractionated: 0.5 IU/mL (ref 0.30–0.70)
Heparin Unfractionated: 0.48 IU/mL (ref 0.30–0.70)

## 2020-01-06 LAB — PROTIME-INR
INR: 1.5 — ABNORMAL HIGH (ref 0.8–1.2)
Prothrombin Time: 17.5 seconds — ABNORMAL HIGH (ref 11.4–15.2)

## 2020-01-06 MED ORDER — ORAL CARE MOUTH RINSE: 15.0000 mL | OROMUCOSAL | Status: DC

## 2020-01-06 MED ORDER — SODIUM CHLORIDE 0.9% FLUSH: 10.0000 mL | INTRAVENOUS | Status: DC | PRN

## 2020-01-06 MED ORDER — CALCIUM CHLORIDE 10 % IV SOLN: 1.0000 g | Freq: Once | INTRAVENOUS | Status: AC

## 2020-01-06 MED ORDER — SODIUM CHLORIDE 0.9 % IV SOLN: 1.0000 g | Freq: Three times a day (TID) | INTRAVENOUS | Status: DC

## 2020-01-06 MED ORDER — SODIUM BICARBONATE 8.4 % IV SOLN
INTRAVENOUS | Status: AC
  Administered 2020-01-06: 50 meq
  Filled 2020-01-06: qty 50

## 2020-01-06 MED ORDER — POLYETHYLENE GLYCOL 3350 17 G PO PACK
17.0000 g | PACK | Freq: Every day | ORAL | Status: DC
  Administered 2020-01-06: 17 g
  Filled 2020-01-06: qty 1

## 2020-01-06 MED ORDER — VECURONIUM BROMIDE 10 MG IV SOLR
8.0000 mg | Freq: Once | INTRAVENOUS | Status: AC
  Administered 2020-01-06: 10:00:00 8 mg via INTRAVENOUS

## 2020-01-06 MED ORDER — PRISMASOL BGK 4/2.5 32-4-2.5 MEQ/L IV SOLN: INTRAVENOUS | Status: DC

## 2020-01-06 MED ORDER — DEXMEDETOMIDINE HCL IN NACL 400 MCG/100ML IV SOLN
0.0000 ug/kg/h | INTRAVENOUS | Status: DC
  Administered 2020-01-06: 15:00:00 1.2 ug/kg/h via INTRAVENOUS
  Administered 2020-01-06: 11:00:00 0.4 ug/kg/h via INTRAVENOUS
  Filled 2020-01-06 (×3): qty 100

## 2020-01-06 MED ORDER — SODIUM CHLORIDE 0.9% FLUSH: 10.0000 mL | Freq: Two times a day (BID) | INTRAVENOUS | Status: DC

## 2020-01-06 MED ORDER — CALCIUM CHLORIDE 10 % IV SOLN
1.0000 g | Freq: Once | INTRAVENOUS | Status: AC
  Administered 2020-01-06: 1 g via INTRAVENOUS

## 2020-01-06 MED ORDER — POLYETHYLENE GLYCOL 3350 17 G PO PACK: 17.0000 g | PACK | Freq: Every day | ORAL | Status: DC | PRN

## 2020-01-06 MED ORDER — DOCUSATE SODIUM 50 MG/5ML PO LIQD: 100.0000 mg | Freq: Two times a day (BID) | ORAL | Status: DC

## 2020-01-06 MED ORDER — CALCIUM GLUCONATE-NACL 1-0.675 GM/50ML-% IV SOLN: 1.0000 g | Freq: Three times a day (TID) | INTRAVENOUS | Status: DC

## 2020-01-06 MED ORDER — DEXTROSE 50 % IV SOLN
INTRAVENOUS | Status: AC
  Filled 2020-01-06: qty 50

## 2020-01-06 MED ORDER — ETOMIDATE 2 MG/ML IV SOLN
20.0000 mg | Freq: Once | INTRAVENOUS | Status: AC
  Administered 2020-01-06: 20 mg via INTRAVENOUS

## 2020-01-06 MED ORDER — SODIUM CHLORIDE 0.9 % IV SOLN
1.0000 mg | Freq: Every day | INTRAVENOUS | Status: DC
  Administered 2020-01-06: 1 mg via INTRAVENOUS
  Filled 2020-01-06 (×2): qty 0.2

## 2020-01-06 MED ORDER — LACTATED RINGERS IV BOLUS: 1000.0000 mL | Freq: Once | INTRAVENOUS | Status: DC

## 2020-01-06 MED ORDER — METRONIDAZOLE IN NACL 5-0.79 MG/ML-% IV SOLN
500.0000 mg | Freq: Three times a day (TID) | INTRAVENOUS | Status: DC
  Administered 2020-01-06: 500 mg via INTRAVENOUS
  Filled 2020-01-06: qty 100

## 2020-01-06 MED ORDER — CALCIUM GLUCONATE-NACL 2-0.675 GM/100ML-% IV SOLN
2.0000 g | Freq: Once | INTRAVENOUS | Status: DC
  Filled 2020-01-06: qty 100

## 2020-01-06 MED ORDER — PANTOPRAZOLE SODIUM 40 MG IV SOLR
40.0000 mg | Freq: Every day | INTRAVENOUS | Status: DC
  Administered 2020-01-06: 40 mg via INTRAVENOUS
  Filled 2020-01-06: qty 40

## 2020-01-06 MED ORDER — MORPHINE 100MG IN NS 100ML (1MG/ML) PREMIX INFUSION: 0.0000 mg/h | INTRAVENOUS | Status: DC

## 2020-01-06 MED ORDER — SODIUM CHLORIDE 0.9 % IV SOLN
2.0000 g | Freq: Three times a day (TID) | INTRAVENOUS | Status: DC
  Administered 2020-01-06: 06:00:00 2 g via INTRAVENOUS
  Filled 2020-01-06 (×4): qty 2

## 2020-01-06 MED ORDER — FENTANYL CITRATE (PF) 100 MCG/2ML IJ SOLN: 50.0000 ug | INTRAMUSCULAR | Status: DC | PRN

## 2020-01-06 MED ORDER — CALCIUM CHLORIDE 10 % IV SOLN
1.0000 g | Freq: Once | INTRAVENOUS | Status: AC
  Administered 2020-01-06: 16:00:00 1 g via INTRAVENOUS

## 2020-01-06 MED ORDER — SODIUM CHLORIDE 0.9 % IV SOLN
1.0000 g | Freq: Three times a day (TID) | INTRAVENOUS | Status: DC
  Administered 2020-01-06: 1 g via INTRAVENOUS
  Filled 2020-01-06 (×4): qty 1

## 2020-01-06 MED ORDER — ACETAMINOPHEN 325 MG PO TABS: 650.0000 mg | ORAL_TABLET | Freq: Four times a day (QID) | ORAL | Status: DC | PRN

## 2020-01-06 MED ORDER — PHENYLEPHRINE HCL-NACL 10-0.9 MG/250ML-% IV SOLN
INTRAVENOUS | Status: AC
  Administered 2020-01-06: 14:00:00 110 ug/min via INTRAVENOUS
  Filled 2020-01-06: qty 250

## 2020-01-06 MED ORDER — PHENYLEPHRINE CONCENTRATED 100MG/250ML (0.4 MG/ML) INFUSION SIMPLE
0.0000 ug/min | INTRAVENOUS | Status: DC
  Administered 2020-01-06: 120 ug/min via INTRAVENOUS
  Filled 2020-01-06 (×2): qty 250

## 2020-01-06 MED ORDER — VANCOMYCIN HCL IN DEXTROSE 1-5 GM/200ML-% IV SOLN
1000.0000 mg | Freq: Two times a day (BID) | INTRAVENOUS | Status: DC
  Administered 2020-01-06: 1000 mg via INTRAVENOUS
  Filled 2020-01-06: qty 200

## 2020-01-06 MED ORDER — ROCURONIUM BROMIDE 50 MG/5ML IV SOLN
50.0000 mg | Freq: Once | INTRAVENOUS | Status: DC
  Filled 2020-01-06: qty 5

## 2020-01-06 MED ORDER — SODIUM CHLORIDE 0.9 % IV SOLN
1.0000 g | Freq: Two times a day (BID) | INTRAVENOUS | Status: DC
  Filled 2020-01-06 (×2): qty 1

## 2020-01-06 MED ORDER — STERILE WATER FOR INJECTION IV SOLN
INTRAVENOUS | Status: DC
  Filled 2020-01-06 (×4): qty 850

## 2020-01-06 MED ORDER — IBUPROFEN 400 MG PO TABS
400.0000 mg | ORAL_TABLET | ORAL | Status: DC | PRN
  Administered 2020-01-06: 02:00:00 400 mg via ORAL
  Filled 2020-01-06: qty 1

## 2020-01-06 MED ORDER — SODIUM CHLORIDE 0.9 % IV BOLUS: 1000.0000 mL | Freq: Once | INTRAVENOUS | Status: DC

## 2020-01-06 MED ORDER — ADULT MULTIVITAMIN LIQUID CH
15.0000 mL | Freq: Every day | ORAL | Status: DC
  Filled 2020-01-06: qty 15

## 2020-01-06 MED ORDER — PRISMASOL BGK 4/2.5 32-4-2.5 MEQ/L REPLACEMENT SOLN: Status: DC

## 2020-01-06 MED ORDER — CALCIUM CARBONATE ANTACID 500 MG PO CHEW
1000.0000 mg | CHEWABLE_TABLET | Freq: Three times a day (TID) | ORAL | Status: DC
  Administered 2020-01-06: 1000 mg via ORAL
  Filled 2020-01-06: qty 5

## 2020-01-06 MED ORDER — FENTANYL CITRATE (PF) 100 MCG/2ML IJ SOLN
50.0000 ug | Freq: Once | INTRAMUSCULAR | Status: AC
  Administered 2020-01-06: 50 ug via INTRAVENOUS

## 2020-01-06 MED ORDER — DOCUSATE SODIUM 100 MG PO CAPS: 100.0000 mg | ORAL_CAPSULE | Freq: Two times a day (BID) | ORAL | Status: DC | PRN

## 2020-01-06 MED ORDER — ACETAMINOPHEN 325 MG RE SUPP: 325.0000 mg | Freq: Four times a day (QID) | RECTAL | Status: DC | PRN

## 2020-01-06 MED ORDER — VITAL HIGH PROTEIN PO LIQD: 1000.0000 mL | ORAL | Status: DC

## 2020-01-06 MED ORDER — CALCIUM GLUCONATE-NACL 2-0.675 GM/100ML-% IV SOLN
2.0000 g | Freq: Once | INTRAVENOUS | Status: AC
  Administered 2020-01-06: 2000 mg via INTRAVENOUS
  Filled 2020-01-06: qty 100

## 2020-01-06 MED ORDER — PHENYLEPHRINE HCL-NACL 10-0.9 MG/250ML-% IV SOLN
0.0000 ug/min | INTRAVENOUS | Status: DC
  Administered 2020-01-06: 11:00:00 40 ug/min via INTRAVENOUS
  Filled 2020-01-06: qty 250

## 2020-01-06 MED ORDER — VASOPRESSIN 20 UNIT/ML IV SOLN
0.0300 [IU]/min | INTRAVENOUS | Status: DC
  Administered 2020-01-06: 0.03 [IU]/min via INTRAVENOUS
  Filled 2020-01-06: qty 2

## 2020-01-06 MED ORDER — NOREPINEPHRINE 16 MG/250ML-% IV SOLN
0.0000 ug/min | INTRAVENOUS | Status: DC
  Administered 2020-01-06: 100 ug/min via INTRAVENOUS
  Filled 2020-01-06 (×2): qty 250

## 2020-01-06 MED ORDER — DOCUSATE SODIUM 50 MG/5ML PO LIQD: 100.0000 mg | Freq: Two times a day (BID) | ORAL | Status: DC | PRN

## 2020-01-06 MED ORDER — ONDANSETRON HCL 4 MG/2ML IJ SOLN
4.0000 mg | Freq: Once | INTRAMUSCULAR | Status: AC
  Administered 2020-01-06: 10:00:00 4 mg via INTRAVENOUS
  Filled 2020-01-06: qty 2

## 2020-01-06 MED ORDER — PIPERACILLIN-TAZOBACTAM 3.375 G IVPB: 3.3750 g | Freq: Three times a day (TID) | INTRAVENOUS | Status: DC

## 2020-01-06 MED ORDER — FENTANYL 2500MCG IN NS 250ML (10MCG/ML) PREMIX INFUSION
50.0000 ug/h | INTRAVENOUS | Status: DC
  Administered 2020-01-06: 50 ug/h via INTRAVENOUS
  Filled 2020-01-06: qty 250

## 2020-01-06 MED ORDER — FENTANYL BOLUS VIA INFUSION
50.0000 ug | INTRAVENOUS | Status: DC | PRN
  Administered 2020-01-06: 50 ug via INTRAVENOUS
  Filled 2020-01-06: qty 50

## 2020-01-06 MED ORDER — STERILE WATER FOR INJECTION IV SOLN
INTRAVENOUS | Status: DC
  Filled 2020-01-06: qty 850

## 2020-01-06 MED ORDER — SODIUM BICARBONATE 8.4 % IV SOLN
200.0000 meq | Freq: Once | INTRAVENOUS | Status: AC
  Administered 2020-01-06: 200 meq via INTRAVENOUS

## 2020-01-06 MED ORDER — VITAL AF 1.2 CAL PO LIQD: 1000.0000 mL | ORAL | Status: DC

## 2020-01-06 MED ORDER — CALCIUM GLUCONATE-NACL 2-0.675 GM/100ML-% IV SOLN
2.0000 g | Freq: Once | INTRAVENOUS | Status: AC
  Administered 2020-01-06: 14:00:00 2000 mg via INTRAVENOUS
  Filled 2020-01-06: qty 100

## 2020-01-06 MED ORDER — CALCIUM CHLORIDE 10 % IV SOLN
INTRAVENOUS | Status: AC
  Administered 2020-01-06: 1 g via INTRAVENOUS
  Filled 2020-01-06: qty 10

## 2020-01-06 MED ORDER — SODIUM CHLORIDE 0.9 % IV BOLUS
1000.0000 mL | Freq: Once | INTRAVENOUS | Status: AC
  Administered 2020-01-06: 1000 mL via INTRAVENOUS

## 2020-01-06 MED ORDER — NOREPINEPHRINE 4 MG/250ML-% IV SOLN
0.0000 ug/min | INTRAVENOUS | Status: DC
  Administered 2020-01-06: 15:00:00 10 ug/min via INTRAVENOUS
  Filled 2020-01-06 (×2): qty 250

## 2020-01-06 MED ORDER — FENTANYL CITRATE (PF) 100 MCG/2ML IJ SOLN: 50.0000 ug | Freq: Once | INTRAMUSCULAR | Status: DC

## 2020-01-06 MED ORDER — PRISMASOL BGK 0/2.5 32-2.5 MEQ/L IV SOLN
INTRAVENOUS | Status: DC
  Filled 2020-01-06 (×10): qty 5000

## 2020-01-06 MED ORDER — MIDAZOLAM HCL 2 MG/2ML IJ SOLN
2.0000 mg | Freq: Once | INTRAMUSCULAR | Status: AC
  Administered 2020-01-06: 12:00:00 2 mg via INTRAVENOUS

## 2020-01-06 MED ORDER — MORPHINE SULFATE (PF) 4 MG/ML IV SOLN
4.0000 mg | INTRAVENOUS | Status: DC | PRN
  Filled 2020-01-06: qty 1

## 2020-01-06 MED ORDER — CALCIUM GLUCONATE-NACL 2-0.675 GM/100ML-% IV SOLN
2.0000 g | Freq: Once | INTRAVENOUS | Status: AC
  Administered 2020-01-06: 07:00:00 2000 mg via INTRAVENOUS
  Filled 2020-01-06: qty 100

## 2020-01-06 MED ORDER — CALCIUM CHLORIDE 10 % IV SOLN
1.0000 g | Freq: Once | INTRAVENOUS | Status: AC
  Administered 2020-01-06: 17:00:00 1 g via INTRAVENOUS

## 2020-01-06 MED ORDER — HYDROMORPHONE HCL 1 MG/ML IJ SOLN: 0.5000 mg | INTRAMUSCULAR | Status: DC | PRN

## 2020-01-06 MED ORDER — CHLORHEXIDINE GLUCONATE 0.12% ORAL RINSE (MEDLINE KIT): 15.0000 mL | Freq: Two times a day (BID) | OROMUCOSAL | Status: DC

## 2020-01-06 MED ORDER — CALCIUM GLUCONATE-NACL 1-0.675 GM/50ML-% IV SOLN
INTRAVENOUS | Status: AC
  Administered 2020-01-06: 16:00:00 1000 mg
  Filled 2020-01-06: qty 100

## 2020-01-06 MED ORDER — HEPARIN SODIUM (PORCINE) 1000 UNIT/ML IJ SOLN
2400.0000 [IU] | Freq: Once | INTRAMUSCULAR | Status: AC
  Administered 2020-01-06: 2400 [IU] via INTRAVENOUS

## 2020-01-06 MED ORDER — CHLORHEXIDINE GLUCONATE CLOTH 2 % EX PADS: 6.0000 | MEDICATED_PAD | Freq: Every day | CUTANEOUS | Status: DC

## 2020-01-06 MED ORDER — SODIUM BICARBONATE 8.4 % IV SOLN
100.0000 meq | Freq: Once | INTRAVENOUS | Status: AC
  Administered 2020-01-06: 10:00:00 100 meq via INTRAVENOUS

## 2020-01-06 MED ORDER — HEPARIN SODIUM (PORCINE) 1000 UNIT/ML DIALYSIS
1000.0000 [IU] | INTRAMUSCULAR | Status: DC | PRN
  Filled 2020-01-06: qty 6

## 2020-01-06 MED ORDER — SODIUM CHLORIDE 0.9 % IV SOLN
INTRAVENOUS | Status: DC | PRN
  Administered 2020-01-06: 500 mL via INTRAVENOUS

## 2020-01-06 MED ORDER — SODIUM CHLORIDE 0.9 % IV SOLN
1.5000 mg/kg/h | INTRAVENOUS | Status: DC
  Administered 2020-01-06: 1.5 mg/kg/h via INTRAVENOUS
  Filled 2020-01-06 (×4): qty 100

## 2020-01-06 MED ORDER — CALCIUM CHLORIDE 10 % IV SOLN
INTRAVENOUS | Status: AC
  Filled 2020-01-06: qty 10

## 2020-01-06 MED ORDER — DEXTROSE 50 % IV SOLN
25.0000 g | INTRAVENOUS | Status: AC
  Administered 2020-01-06: 25 g via INTRAVENOUS

## 2020-01-06 MED ORDER — INSULIN ASPART 100 UNIT/ML ~~LOC~~ SOLN
0.0000 [IU] | SUBCUTANEOUS | Status: DC
  Administered 2020-01-06: 5 [IU] via SUBCUTANEOUS

## 2020-01-09 NOTE — Progress Notes (Signed)
225 of Fentanyl wasted with Althea Grimmer RN in [facility] stericycle.

## 2020-01-09 NOTE — Consult Note (Signed)
[facility] Progress Note    Assessment/ Plan:   1. Acute kidney injury: Baseline creatinine 0.8.  Creatinine today 2.5 increased from 1.04 yesterday.  BUN 15 today.  Most likely prerenal.  This appears to be hypoperfusion/ischemic injury.  Possible ATN mucus casts present on UA from 4/28. 2. Respiratory insufficiency: No evidence of hypoxia.  This appears to be secondary to acidosis.  We will plan to move forward with [name] following discussion with medical decision-maker. 3. Anion gap metabolic acidosis: Bicarb of 19 on hospital admission decreased to under 7 on morning labs.  Anion gap of at least 19.  No blood gases available at present.  Continue bicarb drip  4. hypocalcemia: 6.8 on admission, now downtrending to 4.8.  S/p calcium gluconate x2.  We will plan to replete calcium to at least a ionized calcium of 1.0. 5. Hyponatremia: Sodium 130 this morning.  We will continue to monitor. 6. Severe necrotizing pancreatitis: Pain control: Currently intubated and sedated on fentanyl drip.  Per chart review, total of 4.7 L IVF has been given since admission. 7. Septic shock, secondary to infected necrotizing pancreatitis:  Currently treating with meropenem.    Subjective:   Jack Fernandez is a 54 year old gentleman who was admitted to the hospital for necrotizing pancreatitis.  He has a previous medical history significant for alcohol use, hepatic steatosis, homelessness.  Nephrology was consulted for acute kidney failure and volume overload.  He presented to the hospital 1 day ago with nausea and vomiting and was found to have acute necrotizing pancreatitis.  He is admitted for pain control and fluid resuscitation.  Following admission, he developed a fever which is possibly secondary to enteritis and was started on broad-spectrum antibiotics.  Earlier this morning, he developed respiratory insufficiency and was intubated.  Morning labs demonstrated a severe metabolic acidosis.  Jack Fernandez was intubated and sedated and not able to participate in a conversation during my assessment.   Objective:   BP (!) 115/93   Pulse (!) 138   Temp (!) 101.1 F (38.4 C) (Rectal)   Resp (!) 30   Ht 5\' 9"  (1.753 m)   Wt 86.8 kg   SpO2 100%   BMI 28.26 kg/m   Intake/Output Summary (Last 24 hours) at 01/04/2020 1251 Last data filed at 12/10/2019 1000 Gross per 24 hour  Intake 4949.15 ml  Output --  Net 4949.15 ml   Weight change:   Physical Exam: General: Intubated and sedated.  Ill-appearing middle-aged gentleman. Respiratory: Currently intubated and ventilated.  Good air movement noted bilaterally on auscultation. Cardiac: Distant heart sounds, tachycardic, regular rhythm. Lower extremities: No significant lower extremity edema.  Cool extremities.  Difficult to palpate radial pulse.  Imaging: DG Chest Port 1 View  Result Date: 01/03/2020 CLINICAL DATA:  Central line placement. EXAM: PORTABLE CHEST 1 VIEW COMPARISON:  Earlier same day. FINDINGS: Endotracheal tube and nasogastric tube unchanged. Interval placement of right IJ central venous catheter with tip over the SVC. Lungs are hypoinflated with minimal bibasilar density likely atelectasis unchanged. No evidence of effusion or pneumothorax. Cardiomediastinal silhouette and remainder of the exam is unchanged. IMPRESSION: 1.  Hypoinflation with stable bibasilar atelectasis. 2. Interval placement of right IJ central venous catheter with tip over the SVC. No pneumothorax. Remaining tubes and lines unchanged. Electronically Signed   By: Marin Olp M.D.   On: 12/13/2019 11:46   DG CHEST PORT 1 VIEW  Result Date: 12/27/2019 CLINICAL DATA:  Acute respiratory failure. Endotracheal and nasogastric tube  placement. EXAM: PORTABLE CHEST 1 VIEW COMPARISON:  12/17/2019 FINDINGS: New endotracheal tube is seen with tip approximately 2.8 cm above the carina. A new nasogastric tube is seen with tip in the proximal stomach. Low lung volumes  are again seen with bibasilar atelectasis, which is not significant changed. Heart size is within normal limits. IMPRESSION: 1. New endotracheal tube and nasogastric tube in appropriate position. 2. No significant change in low lung volumes and bibasilar atelectasis. Electronically Signed   By: Danae Orleans M.D.   On: 12/29/2019 10:50   DG CHEST PORT 1 VIEW  Result Date: 01/01/2020 CLINICAL DATA:  Tachypnea EXAM: PORTABLE CHEST 1 VIEW COMPARISON:  Jan 28, 2020 FINDINGS: No significant interval change in very low volume AP portable examination with bibasilar atelectasis or scarring. No new airspace opacity. IMPRESSION: No significant interval change in very low volume AP portable examination with bibasilar atelectasis or scarring. No new airspace opacity. Electronically Signed   By: Lauralyn Primes M.D.   On: 12/23/2019 10:09   DG Chest Port 1 View  Result Date: 01-28-20 CLINICAL DATA:  Pain EXAM: PORTABLE CHEST 1 VIEW COMPARISON:  Chest radiograph September 16, 2019; chest CT September 24, 2019 FINDINGS: There is mild bibasilar atelectatic change. There is no edema or airspace opacity. Heart size and pulmonary vascularity are normal. Right-sided osteophytes in the thoracic spine again noted. There is postoperative change in the lower cervical region. IMPRESSION: Mild bibasilar atelectasis. Lungs otherwise clear. Stable cardiac silhouette. Electronically Signed   By: Bretta Bang III M.D.   On: 01-28-2020 13:59   CT Angio Abd/Pel W and/or Wo Contrast  Result Date: 01/28/2020 CLINICAL DATA:  Abdominal pain.  History of alcohol use. EXAM: CTA ABDOMEN AND PELVIS WITH CONTRAST TECHNIQUE: Multidetector CT imaging of the abdomen and pelvis was performed using the standard protocol during bolus administration of intravenous contrast. Arterial and venous phase images obtained. Multiplanar reconstructed images and MIPs were obtained and reviewed to evaluate the vascular anatomy. CONTRAST:  OMNIPAQUE IOHEXOL 350  MG/ML SOLN COMPARISON:  None. FINDINGS: VASCULAR Aorta: No abdominal aortic aneurysm or dissection. Abdominal aorta widely patent. Celiac: Celiac artery and its branches are widely patent. No aneurysm or dissection involving these vessels. SMA: Superior mesenteric artery and its branches are widely patent. No aneurysm or dissection involving these vessels. Renals: There is a single renal artery on each side. Each renal artery and its branches are widely patent. No aneurysm or dissection. No evident fibromuscular dysplasia. IMA: Inferior mesenteric artery is somewhat diminutive but patent. No aneurysm or dissection involving this vessel or its branches. Inflow: There are occasional foci of calcification in each proximal hypogastric artery. No hemodynamically significant obstruction noted in pelvic arterial vessels. No aneurysm or dissection involving these vessels. Proximal Outflow: Bilateral common femoral and visualized portions of the superficial and profunda femoral arteries are patent without evidence of aneurysm, dissection, vasculitis or significant stenosis. Veins: There is a focal thrombus in the proximal main portal vein, incompletely obstructing. No other venous defects evident. Review of the MIP images confirms the above findings. NON-VASCULAR Lower chest: Atelectatic change noted in each lower lung region. Lung bases otherwise clear. Hepatobiliary: There is diffuse hepatic steatosis. No focal liver lesions are appreciable. Gallbladder is borderline distended without wall thickening. There is no biliary duct dilatation. Pancreas: There is enhancement of the pancreatic head and uncinate process. There is loss of enhancement of the remainder of the pancreas consistent with extensive pancreatic necrosis. There is diffuse pancreatic edema consistent with acute pancreatitis. No  well-defined mass or pseudocyst evident. No pancreatic duct dilatation. There is extensive fluid surrounding the pancreas with fluid  tracking from the peripancreatic region on the left posteriorly to surround the posterior spleen. Fluid tracks anteriorly to partially surround the stomach. Fluid tracks to the right surrounding the distal stomach and proximal duodenum. Fluid tracks more laterally to abut the liver. Fluid tracks more inferiorly, surrounding the third portion of the duodenum and tracking into the right mid to lower abdomen. These are all changes indicative of acute pancreatitis. A small amount of fluid is noted in the dependent portion of the pelvis as well. Spleen: No splenic lesions are evident. Adrenals/Urinary Tract: Adrenals bilaterally appear unremarkable. Kidneys bilaterally show no evident mass or hydronephrosis. No renal or ureteral calculus. Urinary bladder wall thickness normal. Stomach/Bowel: There is wall thickening involving portions of the distal stomach and most of the duodenum consistent with nearby pancreatitis. There are loops of wall thickening involving jejunum which in part may be due to the pancreatitis. A degree of underlying enteritis may also be present. No evident bowel obstruction. The terminal ileum appears normal. There is no evident free air or portal venous air. No intramural air noted. Lymphatic: No appreciable adenopathy evident in the abdomen or pelvis. Reproductive: Prostate and seminal vesicles are normal in size and contour. No evident pelvic mass. Other: Appendix appears normal. No abscess in the abdomen or pelvis. There is fat in each inguinal ring. Areas of ascites, primarily loculated, and largely if not entirely due to the extensive pancreatitis. Musculoskeletal: There is degenerative change in the lumbar spine. There is congenital narrowing throughout essentially the entire lumbar spine. There is anterior wedging of the L2 vertebral body. No blastic or lytic bone lesions. No abdominal wall or intramuscular lesions are evident. IMPRESSION: VASCULAR 1. No aneurysm or dissection involving the  aorta, major mesenteric, or major pelvic arterial vessels. 2. Mild atherosclerotic plaque in each hypogastric artery. No other appreciable atherosclerosis. No fibromuscular dysplasia. NON-VASCULAR 1. Extensive acute pancreatitis with extensive peripancreatic fluid. Fluid tracks from the pancreas throughout the upper abdomen and to a lesser extent into the lower abdomen and pelvis,, primarily on the right side. 2. Extensive pancreatic necrosis with lack of enhancement of the body and tail the pancreas. Most of the head of the pancreas in the uncinate process of the pancreas show enhancement. There is diffuse pancreatic edema. No pancreatic mass or pancreatic duct dilatation. 3. 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 This finding is best appreciated on axial slice 37 series 11; coronal slice 83 series 14, and sagittal slices 91, 92, and 93 series 15. No other thrombus evident. 4. Wall thickening in multiple loops of small bowel, likely due to pancreatitis with potential degree of enteritis superimposed. No bowel obstruction. No evidence of bowel ischemia. No free air or portal venous air. 5.  Congenital narrowing throughout the lumbar region. 6.  Diffuse hepatic steatosis. Electronically Signed   By: Bretta Bang III M.D.   On: 12/23/2019 17:51    Labs: BMET Recent Labs  Lab 12/21/2019 1315 01-12-2020 0407 2020-01-12 0752 Jan 12, 2020 1227  NA 132* 130*  --  132*  K 4.6 5.0  --  6.0*  CL 97* 104  --   --   CO2 19* <7*  --   --   GLUCOSE 75 186*  --   --   BUN 9 15  --   --   CREATININE 1.04 2.57*  --   --  CALCIUM 6.8* 4.8*  --   --   PHOS  --   --  3.8  --    CBC Recent Labs  Lab 01-09-20 1102 12/27/2019 0407 12/11/2019 1227  WBC 12.0* 12.9*  --   HGB 16.4 14.4 13.9  HCT 49.6 42.8 41.0  MCV 87.5 84.9  --   PLT 179 PLATELET CLUMPS NOTED ON SMEAR, COUNT APPEARS DECREASED  --     Medications:    . Chlorhexidine Gluconate Cloth  6 each Topical Daily   . docusate  100 mg Per Tube BID  . fentaNYL (SUBLIMAZE) injection  50 mcg Intravenous Once  . insulin aspart  0-15 Units Subcutaneous Q4H  . [START ON 01/07/2020] multivitamin  15 mL Per Tube Daily  . pantoprazole (PROTONIX) IV  40 mg Intravenous Daily  . polyethylene glycol  17 g Per Tube Daily  . rocuronium  50 mg Intravenous Once  . thiamine  100 mg Oral Daily   Or  . thiamine  100 mg Intravenous Daily     Mirian Mo, MD [facility] Family Medicine Resident, PGY2 12/27/2019, 12:51 PM

## 2020-01-09 NOTE — Procedures (Signed)
Intubation Procedure Note Jack Fernandez [id number] [birth year]  Procedure: Intubation Indications: Respiratory insufficiency  Procedure Details Consent: Risks of procedure as well as the alternatives and risks of each were explained to the patient.  Consent for procedure obtained. Time Out: Verified patient identification, verified procedure, site/side was marked, verified correct patient position, special equipment/implants available, medications/allergies/relevent history reviewed, required imaging and test results available.  Performed  Glide scope used   Evaluation Hemodynamic Status: BP stable throughout; O2 sats: stable throughout Patient's Current Condition: stable Complications: No apparent complications Patient did tolerate procedure well. Chest X-ray ordered to verify placement.  CXR: pending.   Beola Cord 12/23/2019

## 2020-01-09 NOTE — Progress Notes (Signed)
Pharmacy Antibiotic Note  Jack Fernandez is a 54 y.o. male admitted on 01/24/20 with necrotizing pancreatitis.  Pharmacy has been consulted for piperacillin/tazobactam dosing.  Renal function worsened significantly overnight Scr 1.04> 2.57 despite several fluid boluses. LA still elevated, WBC 12.9, Tmax 100.3, HR 150s. Calculated ClCr ~35 ml/min but actual clearance may be lower given acute worsening but still above threshold for dose adjustment.   Plan: Discontinue vancomycin, cefepime and metronidazole  Start piperacillin/tazobactam 3.375g Q8 hr  Monitor cultures, clinical status, renal fx Narrow abx as able and f/u duration    Height: 5\' 9"  (175.3 cm) Weight: 86.8 kg (191 lb 5.8 oz) IBW/kg (Calculated) : 70.7  Temp (24hrs), Avg:99.2 F (37.3 C), Min:97.8 F (36.6 C), Max:100.3 F (37.9 C)  Recent Labs  Lab 2020-01-24 1102 01-24-20 1230 01-24-2020 1315 01/24/20 1322 24-Jan-2020 1533 01-24-20 2001 Jan 24, 2020 2305 12/24/2019 0407  WBC 12.0*  --   --   --   --   --   --  12.9*  CREATININE  --   --  1.04  --   --   --   --  2.57*  LATICACIDVEN  --  6.5*  --  6.0* 6.4* 4.5* 4.9*  --     Estimated Creatinine Clearance: 35 mL/min (A) (by C-G formula based on SCr of 2.57 mg/dL (H)).    Allergies  Allergen Reactions  . Amoxicillin Diarrhea    Antimicrobials this admission: Pip/tazo 4/28 >> Vanco 4/28  Cefe 4/28   MTZ 4/28   Microbiology results: 4/28 BCx: pend 4/28 hepatitis ABC non reactive  4/27 covid/flu neg   Thank you for allowing pharmacy to be a part of this patient's care.  5/27, PharmD, BCPS, BCCP Clinical Pharmacist  Please check AMION for all [facility] Pharmacy phone numbers After 10:00 PM, call Main Pharmacy [phone]

## 2020-01-09 NOTE — Progress Notes (Signed)
UPDATE NOTE:  Pt becoming progressively hypotensive throughout the day. Now on vasopressin 0.59mcg/min, levo 112mcg/min, and neo 448mcg/min. MAPs remaining in the 30s-50s with occasional 60s. Severe hypocalcemia also present and poorly responsive to calc gluc and calc chloride boluses. Phos 4.4. Acidosis still present but improved from this morning. Has been receiving numerous bicarb boluses today for this as well. Lactate trending up 4.0>>8.7.  Spoke with pt's brother and cousin regarding progressive multiorgan failure. Suspect that he will pass this evening/tonight, given his poor response despite high dose pressor support. Cousin inquired regarding transplant so will get in touch with that team. Discussed that this would be a good time to have other family come to the hospital   Will continue discussions with family regarding GOC.  Jack Radon, MD Feb 01, 2020  5:47 PM

## 2020-01-09 NOTE — Progress Notes (Signed)
CRITICAL VALUE ALERT  Critical Value: K+ 6.4, Ca 4.5, Ionized Ca .61  Date & Time Notied: 12/13/2019 1330  Provider Notified: Vallery Sa  Orders Received/Actions taken: Calcium Gluconate and STAT Renal Panel

## 2020-01-09 NOTE — Progress Notes (Signed)
Patient activity dying. Met with family and discussed situation at length. They would like Mr. Joos to pass in peace. No further escalation of care, DNR, morphine drip.  Erskine Emery MD PCCM

## 2020-01-09 NOTE — Progress Notes (Signed)
Pharmacy Antibiotic Note  Jack Fernandez is a 54 y.o. male admitted on 12/18/2019 with necrotizing pancreatitis.  Pharmacy has been consulted to change piperacillin/tazobactam to meropenem d/t worsening.  Renal function worsened significantly overnight Scr 1.04> 2.57>3.8 despite several fluid boluses. LA still elevated, WBC 12.9, Tmax 100.3, HR 150s. Calculated ClCr ~35 ml/min but actual clearance may be lower given acute worsening but still above threshold for dose adjustment. Renal has been involved and patient is starting on [name] this afternoon.   Plan: Discontinue vancomycin, cefepime, metonidazole, and zosyn Start meropenem 1g q8 hours Monitor cultures, clinical status, renal fx Narrow abx as able and f/u duration    Height: 5\' 9"  (175.3 cm) Weight: 86.8 kg (191 lb 5.8 oz) IBW/kg (Calculated) : 70.7  Temp (24hrs), Avg:99.1 F (37.3 C), Min:97.8 F (36.6 C), Max:100.3 F (37.9 C)  Recent Labs  Lab 01/08/2020 1102 12/15/2019 1230 12/26/2019 1315 12/19/2019 1322 12/11/2019 1533 12/21/2019 2001 01/02/2020 2305 Jan 10, 2020 0407  WBC 12.0*  --   --   --   --   --   --  12.9*  CREATININE  --   --  1.04  --   --   --   --  2.57*  LATICACIDVEN  --  6.5*  --  6.0* 6.4* 4.5* 4.9*  --     Estimated Creatinine Clearance: 35 mL/min (A) (by C-G formula based on SCr of 2.57 mg/dL (H)).    Allergies  Allergen Reactions  . Amoxicillin Diarrhea    Antimicrobials this admission: Vanco 4/28 x1 Cefe 4/28  x1 MTZ 4/28 x1 Meropenem 4/28>>  Microbiology results: 4/28 BCx: pend 4/28 hepatitis ABC non reactive  4/27 covid/flu neg   Thank you for allowing pharmacy to be a part of this patient's care. 5/27 PharmD., BCPS Clinical Pharmacist 01/10/20 10:19 AM   Please check AMION for all [facility] Pharmacy phone numbers After 10:00 PM, call Main Pharmacy [phone]

## 2020-01-09 NOTE — Progress Notes (Signed)
Dr Chestine Spore made aware of patient having continued hypotension despite titrations in Neo. Neo currently at 220 mcg. Orders received for Levophed and Vasopressin.

## 2020-01-09 NOTE — Plan of Care (Addendum)
Hemodynamically unstable despite escalating pressors. Pushed IV calcium with a transient response, but hypotensive again within minutes. Several grams pushed in the past hour and bicarb held per nephro recommendations given his severe hypocalcemia. Repeat ABG with decreasing bicarb and pH and elevated ionized calcium. Decreasing BP again on max neosynepyhrine, vaso, and escalating levophed, which responded to a bolus of bicarb. Infusion restarted. Vent increased to help hyperventilate him. Discussed with Dr. Malen Gauze. Overall guarded prognosis. Family has been contacted and requested to return to the hospital to discuss his ongoing care.   Additional 40 minutes critical care time.  Steffanie Dunn, DO 01/01/2020 4:39 PM [facility]   Mr. Vana brother and sister are at bedside.  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  They are aware that he is likely to not make it through the night given progressive multiorgan failure.  Steffanie Dunn, DO 12/23/2019 6:37 PM [facility]

## 2020-01-09 NOTE — Progress Notes (Signed)
0800 Pt MEWS red at a 6. Temp 98.3 oral, BP 93/79(84), HR 144, RR 27, 100% O2 room air. Pt oriented x 3, clammy and diaphoretic. Extremities cool to the touch. Abdomen distended. Per night shift RN, pt has not urinated since he arrived from ED around 4:30 am.   0809 Notified charge RN and paged MD in regards to pt condition.   7022 Rapid response called due to pt increased work of breathing. Pt tachypneic with RR 40-45. Rectal temp 101.1 F. IVF bolus started. Bladder scan . MD and rapid response RN at bedside. Foley placed per order.   0930 Pt complaining of nausea, IV zofran administered. Critical care team at bedside. Awaiting ICU bed.  [id number] Transferred pt to 2H13 with rapid response and SWAT RN. Report given to receiving RN.   Hazle Nordmann, RN

## 2020-01-09 NOTE — Progress Notes (Signed)
ANTICOAGULATION CONSULT NOTE  Pharmacy Consult for Heparin Indication: portal vein thrombosis  Allergies  Allergen Reactions  . Amoxicillin Diarrhea    Patient Measurements: Height: 5\' 9"  (175.3 cm) Weight: 86.8 kg (191 lb 5.8 oz) IBW/kg (Calculated) : 70.7 Heparin Dosing Weight: 89.1 kg  Vital Signs: Temp: 101.1 F (38.4 C) (04/28 0830) Temp Source: Rectal (04/28 0830) BP: 115/93 (04/28 1054) Pulse Rate: 138 (04/28 1054)  Labs: Recent Labs    12/27/2019 1102 12/31/2019 1102 01/04/2020 1315 01/03/2020 1533 01/30/20 0407 2020/01/30 1200 Jan 30, 2020 1227  HGB 16.4   < >  --   --  14.4  --  13.9  HCT 49.6  --   --   --  42.8  --  41.0  PLT 179  --   --   --  PLATELET CLUMPS NOTED ON SMEAR, COUNT APPEARS DECREASED  --   --   APTT  --   --   --  31  --   --   --   LABPROT  --   --   --  13.8  --  17.5*  --   INR  --   --   --  1.1  --  1.5*  --   HEPARINUNFRC  --   --   --   --  0.50 0.48  --   CREATININE  --   --  1.04  --  2.57* 3.84*  --   CKTOTAL  --   --   --  196  --   --   --    < > = values in this interval not displayed.    Estimated Creatinine Clearance: 23.4 mL/min (A) (by C-G formula based on SCr of 3.84 mg/dL (H)).  Assessment: [name] with portal vein thrombus for heparin.  Heparin level at goal on recheck this morning. Heparin turned off for a few hours for HD line placement, will restart at previous rate and recheck in am.   INR up to 1.5, starting crrt.  Goal of Therapy:  Heparin level 0.3-0.7 units/ml Monitor platelets by anticoagulation protocol: Yes   Plan:  Continue Heparin at current rate   57 PharmD., BCPS Clinical Pharmacist 01/30/2020 2:03 PM

## 2020-01-09 NOTE — Progress Notes (Signed)
Brief Nutrition Follow-up:   After initial nutrition assessment today, RN indicating difficulty maintaining MAP >65, noted diastolic pressure not consistently >50 via A-line.  Recommend holding trickle TF at this time until pt BP stabilizes and able to maintain MAP >65 or diastolic pressure >50. This was discussed with Olivia RN  Romelle Starcher MS, RDN, LDN, CNSC RD Pager Number and RD On-Call Pager Number Located in [place]

## 2020-01-09 NOTE — Progress Notes (Signed)
2mg  of Versed wasted in Stericycle with 

## 2020-01-09 NOTE — Discharge Summary (Signed)
Date of Admission: 12/13/2019 Date of Death: 11-Jan-2020 Diagnoses:  Multiorgan failure with liver failure from severe acute necrotizing alcoholic pancreatitis  HPI:  Jack Fernandez is a 54 y.o. male with medical history significant for hepatic steatosis, alcohol use, hypertension, cervical spine myopathy, peripheral neuropathy, and depression who presents with concerns of acute abdominal pain.  Patient reports drinking about 2 cups of wine this morning when he recently developed acute lower abdominal pain.  Endorses nausea, vomiting and diarrhea.  Pain is cramping in nature and is constant 8 out of 10.  He reports that he drinks about 2 to 3 cups of wine daily since "forever."  He denies any other illicit drug use.  Denies any chest pain, palpitations.  Denies any shortness of breath.  Patient is homeless and does not have a primary physician or take any prescription medications.  Denies history of any abdominal surgery.  ED Course: Temp of 98.1, tachycardic up to 140s and tachypnea on room air. WBC of 12. Na of 132, AST of 1201, ALT of 179, total bilirubin of 4.4, anion gap of 16. Lipase of 1499. Lactic acid of 6.5--> 6.  CTA angio abdomen and pelvic was obtained by ED PA since patient's pain at that time seem out of proportion to exam.  No aneurysm or dissection was seen.  There is extensive acute pancreatitis and necrosis to the body and tail of the pancreas.  There is a focal area of thrombus in the proximal main portal vein.  Hospital Course:  Patient was admitted to stepdown unit with aggressive hydration, antibiotics, and supportive care.   Unfortunately he developed fulminant respiratory, renal, liver failure requiring emergent intubation and [name].  Despite maximal supportive care he continued to deteriorate and family decided to allow him to pass in peace.  Myrla Halsted MD PCCM

## 2020-01-09 NOTE — Progress Notes (Signed)
1515: Dr Chestine Spore paged to bedside to patient agonal breathing on ventilator and continued hypotension. Dr Chestine Spore made aware of critical value of Calcium of 4.5.Patient given a total of 4 Sodium Bicarb pushes, 4 Calcium Chloride pushes, 2 Calcium Gluconate drips. Patient continued to decline more resuscitation medications given. Please see MAR. MD called family for GOC discussion. RN maxed on Levophed, Neo, and Vaso and unable to keep MAP goal. Family ultimately decided to place patient on comfort care.

## 2020-01-09 NOTE — Progress Notes (Signed)
Pharmacy Antibiotic Note  Jack Fernandez is a 54 y.o. male admitted on 01/29/20 with abdominal pain.  Pharmacy has been consulted for Vancomycin/Cefepime dosing fever. Likely intra-abdominal source. WBC 12. Renal function good.   Plan: Vancomycin 1000 mg IV q12h >>Estimated AUC: 455 Cefepime 2g IV q8h Flagyl per MD Trend WBC, temp, renal function  F/U infectious work-up Drug levels as indicated   Height: 5\' 9"  (175.3 cm) Weight: 86.8 kg (191 lb 5.8 oz) IBW/kg (Calculated) : 70.7  Temp (24hrs), Avg:99.2 F (37.3 C), Min:97.8 F (36.6 C), Max:100.3 F (37.9 C)  Recent Labs  Lab 01/29/20 1102 Jan 29, 2020 1230 01-29-20 1315 01-29-2020 1322 01-29-2020 1533 01/29/20 2001 2020-01-29 2305  WBC 12.0*  --   --   --   --   --   --   CREATININE  --   --  1.04  --   --   --   --   LATICACIDVEN  --  6.5*  --  6.0* 6.4* 4.5* 4.9*    Estimated Creatinine Clearance: 86.5 mL/min (by C-G formula based on SCr of 1.04 mg/dL).    Allergies  Allergen Reactions  . Amoxicillin Diarrhea    01/07/20, PharmD, BCPS Clinical Pharmacist Phone: [phone]

## 2020-01-09 NOTE — Plan of Care (Addendum)
AKI 2/2 ischemic and pre-renal insults with necrotizing pancreatitis.  Now with concern for septic and distributive shock.  Full note to follow.     Spoke with patient's brother Loni Dolly at [phone] regarding risks, benefits, and indications for renal replacement therapy and he provided consent for renal replacement therapy.    Will initiate [name]. UF goal zero for now.  Replete calcium.  Trend labs closely as per [name] protocol.  Einar Pheasant is in place. Appreciate critical care.  On bicarb gtt for now   Estanislado Emms 12/13/2019 12:51 PM

## 2020-01-09 NOTE — Procedures (Signed)
Hemodialysis Catheter Insertion Procedure Note Jack Fernandez [id number] 10-Mar-1966  Procedure: Insertion of Hemodialysis Catheter Indications: Dialysis Access   Procedure Details Consent: Risks of procedure as well as the alternatives and risks of each were explained to the (patient/caregiver).  Consent for procedure obtained. Time Out: Verified patient identification, verified procedure, site/side was marked, verified correct patient position, special equipment/implants available, medications/allergies/relevent history reviewed, required imaging and test results available.  Performed  Maximum sterile technique was used including antiseptics, cap, gloves, gown, hand hygiene, mask and sheet. Skin prep: Chlorhexidine; local anesthetic administered Triple lumen hemodialysis catheter was inserted into right internal jugular vein using the Seldinger technique.  Evaluation Blood flow good Complications: No apparent complications Patient did tolerate procedure well. Chest X-ray ordered to verify placement.  CXR: pending.  Dr. Ephriam Knuckles under direction of Anders Simmonds NP and Lorin Glass 12/11/2019

## 2020-01-09 NOTE — Procedures (Signed)
Arterial Catheter Insertion Procedure Note Jack Fernandez [id number] [birth year]  Procedure: Insertion of Arterial Catheter  Indications: Blood pressure monitoring  Procedure Details Consent: Risks of procedure as well as the alternatives and risks of each were explained to the (patient/caregiver).  Consent for procedure obtained. Time Out: Verified patient identification, verified procedure, site/side was marked, verified correct patient position, special equipment/implants available, medications/allergies/relevent history reviewed, required imaging and test results available.  Performed  Maximum sterile technique was used including antiseptics, cap, gloves, gown, hand hygiene, mask and sheet. Skin prep: Chlorhexidine; local anesthetic administered 20 gauge catheter was inserted into left femoral artery using the Seldinger technique. ULTRASOUND GUIDANCE USED: YES Evaluation Blood flow good; BP tracing good. Complications: No apparent complications.  Dr. Ephriam Knuckles under direction of NP Tanja Port and Lorin Glass MD 01/08/20

## 2020-01-09 NOTE — Progress Notes (Signed)
PROGRESS NOTE    Jack Fernandez  OEV:[id number] DOB: Apr 06, 1966 DOA: 12/24/2019 PCP: Storm Frisk, MD    Brief Narrative:  54 year old gentleman with history of hepatic steatosis, alcohol use, hypertension, cervical spine myopathy, peripheral neuropathy and depression presented to the emergency room with acute abdominal pain.  Patient does drink beer every day, he is homeless.  He also was not drinking in the morning of coming to ER.  He started having nausea, vomiting and diarrhea.  Cramping abdominal pain. In the emergency room, tachycardic, normotensive.  Temperatures normal.  On room air.  WBC count 12,000.  AST 1200, ALT 179, bilirubin 4.4.  Lipase was 1499.  Lactic acid 6.5.  CTA of the abdomen pelvis showed extensive acute pancreatitis and necrosis of the body and tail of the pancreas.  Focal area of thrombus in the proximal main portal vein. 4/27: Resuscitated, admitted to the medical floor. 4/28: In the morning, patient with no urine output, severe abdominal pain, blood pressures low normal, acute renal failure and hypercalcemia.  Resuscitated further and transferred to intensive care unit.  Assessment & Plan:   Principal Problem:   Acute necrotizing pancreatitis Active Problems:   Alcohol use   Pancreatitis   Hypotension   Sinus tachycardia   Transaminitis   Serum total bilirubin elevated   Lactic acidosis  Severe necrotizing pancreatitis , acute pancreatitis due to alcohol use Acute renal failure  Alcoholism Acute liver failure Portal vein thrombosis  Received total 3 L of isotonic fluid overnight, he will need more resuscitation, prescribed 2 more liters of bolus isotonic fluid and to continue maintenance Ringer lactate. Foley catheter to measure output and abdominal pressure.  Catheter was inserted at the bedside, drained about 200 mL dark urine. N.p.o. except ice chips, adequate IV opiates for pain relief.  Will avoid NSAIDs due to acute renal failure. Tylenol less  than 2 g a day due to acute liver failure. Blood cultures were drawn, probably metabolic fever, however will cover with Zosyn. Continue heparin drip in order to treat portal vein thrombosis. Very high risk of alcohol withdrawal, started on CIWA protocol. Worsening liver function tests, acute renal failure, hypocalcemia puts patient in very high risk of mortality. Due to complexity of care, will need ICU level of care. Discussed with ICU attending and patient transferred to intensive care unit to continue further care. I called and updated patient's cousin Eddi on the phone, described him critical situation and encouraged him to visit his brother in the hospital.   DVT prophylaxis: Heparin infusion Code Status: Full code Family Communication: Cousin brother, Mr. Jacques Earthly Disposition Plan: Status is: Inpatient  Remains inpatient appropriate because:Inpatient level of care appropriate due to severity of illness   Dispo: The patient is from: Home              Anticipated d/c is to: ICU              Anticipated d/c date is: > 3 days              Patient currently is not medically stable to d/c.          Consultants:   PCCM  Procedures:   None  Antimicrobials:  Antibiotics Given (last 72 hours)    Date/Time Action Medication Dose Rate   01/28/20 0206 New Bag/Given   metroNIDAZOLE (FLAGYL) IVPB 500 mg 500 mg 100 mL/hr   01/28/2020 0414 New Bag/Given   vancomycin (VANCOCIN) IVPB 1000 mg/200 mL premix 1,000 mg 200 mL/hr  12/17/2019 0557 New Bag/Given   ceFEPIme (MAXIPIME) 2 g in sodium chloride 0.9 % 100 mL IVPB 2 g 200 mL/hr         Subjective: Patient seen and examined.  Overnight events noted.  Patient remains hypotensive and unable to make any urine overnight. Rapid response was called, patient was attended at the bedside. He was awake but lethargic, was able to tell me that his pain is slightly better after a dose of Ativan. Feels nauseated. We will further resuscitated  him with more fluid and transferred him to ICU.  Objective: Vitals:   01/02/2020 0022 12/25/2019 0131 12/14/2019 0332 12/28/2019 0800  BP: 101/76 107/84 114/83 93/79  Pulse: (!) 149 (!) 157 (!) 158 (!) 144  Resp: (!) 34 (!) 35 (!) 31 (!) 27  Temp: 100 F (37.8 C) 100.3 F (37.9 C) 99.7 F (37.6 C) 98.3 F (36.8 C)  TempSrc: Oral Oral Oral Oral  SpO2: 94% 94% 92% 100%  Weight:      Height:        Intake/Output Summary (Last 24 hours) at 12/31/2019 0853 Last data filed at 12/10/2019 0656 Gross per 24 hour  Intake 4712.66 ml  Output --  Net 4712.66 ml   Filed Weights   01-13-2020 1055 2020-01-13 2224  Weight: 90.7 kg 86.8 kg    Examination:  General exam: Sick looking, icteric, lethargic.  Able to keep up conversation when awake. Respiratory system: Clear to auscultation. Respiratory effort normal.  No added sounds. Cardiovascular system: S1 & S2 heard, RRR.  Tachycardic.   Gastrointestinal system: Abdomen is distended, diffuse tenderness, no localized rigidity or guarding.  Bowel sounds present.   Central nervous system: Alert and oriented on conversation.  No focal neurological deficits. Extremities: Symmetric 5 x 5 power.  Moving all extremities. Skin: No rashes, lesions or ulcers   Data Reviewed: I have personally reviewed following labs and imaging studies  CBC: Recent Labs  Lab 01-13-20 1102 12/23/2019 0407  WBC 12.0* 12.9*  HGB 16.4 14.4  HCT 49.6 42.8  MCV 87.5 84.9  PLT 179 PLATELET CLUMPS NOTED ON SMEAR, COUNT APPEARS DECREASED   Basic Metabolic Panel: Recent Labs  Lab Jan 13, 2020 1315 12/14/2019 0407 12/14/2019 0752  NA 132* 130*  --   K 4.6 5.0  --   CL 97* 104  --   CO2 19* <7*  --   GLUCOSE 75 186*  --   BUN 9 15  --   CREATININE 1.04 2.57*  --   CALCIUM 6.8* 4.8*  --   PHOS  --   --  3.8   GFR: Estimated Creatinine Clearance: 35 mL/min (A) (by C-G formula based on SCr of 2.57 mg/dL (H)). Liver Function Tests: Recent Labs  Lab 13-Jan-2020 1315  12/11/2019 0407  AST 1,201* 1,754*  ALT 179* 187*  ALKPHOS 171* 180*  BILITOT 4.4* 8.3*  PROT 7.1 5.6*  ALBUMIN 2.7* 2.1*   Recent Labs  Lab 2020/01/13 1315 12/31/2019 0407  LIPASE 1,499* 637*   No results for input(s): AMMONIA in the last 168 hours. Coagulation Profile: Recent Labs  Lab 01-13-2020 1533  INR 1.1   Cardiac Enzymes: Recent Labs  Lab Jan 13, 2020 1533  CKTOTAL 196   BNP (last 3 results) No results for input(s): PROBNP in the last 8760 hours. HbA1C: No results for input(s): HGBA1C in the last 72 hours. CBG: No results for input(s): GLUCAP in the last 168 hours. Lipid Profile: No results for input(s): CHOL, HDL, LDLCALC, TRIG, CHOLHDL, LDLDIRECT  in the last 72 hours. Thyroid Function Tests: No results for input(s): TSH, T4TOTAL, FREET4, T3FREE, THYROIDAB in the last 72 hours. Anemia Panel: No results for input(s): VITAMINB12, FOLATE, FERRITIN, TIBC, IRON, RETICCTPCT in the last 72 hours. Sepsis Labs: Recent Labs  Lab 01/04/2020 1322 12/10/2019 1533 12/12/2019 2001 01/04/2020 2305  LATICACIDVEN 6.0* 6.4* 4.5* 4.9*    Recent Results (from the past 240 hour(s))  Respiratory Panel by RT PCR (Flu A&B, Covid) - Nasopharyngeal Swab     Status: None   Collection Time: 12/20/2019  6:38 PM   Specimen: Nasopharyngeal Swab  Result Value Ref Range Status   SARS Coronavirus 2 by RT PCR NEGATIVE NEGATIVE Final    Comment: (NOTE) SARS-CoV-2 target nucleic acids are NOT DETECTED. The SARS-CoV-2 RNA is generally detectable in upper respiratoy specimens during the acute phase of infection. The lowest concentration of SARS-CoV-2 viral copies this assay can detect is 131 copies/mL. A negative result does not preclude SARS-Cov-2 infection and should not be used as the sole basis for treatment or other patient management decisions. A negative result may occur with  improper specimen collection/handling, submission of specimen other than nasopharyngeal swab, presence of viral  mutation(s) within the areas targeted by this assay, and inadequate number of viral copies (<131 copies/mL). A negative result must be combined with clinical observations, patient history, and epidemiological information. The expected result is Negative. Fact Sheet for Patients:  https://www.moore.com/https://www.fda.gov/media/142436/download Fact Sheet for Healthcare Providers:  https://www.young.biz/https://www.fda.gov/media/142435/download This test is not yet ap proved or cleared by the [place] FDA and  has been authorized for detection and/or diagnosis of SARS-CoV-2 by FDA under an Emergency Use Authorization (EUA). This EUA will remain  in effect (meaning this test can be used) for the duration of the COVID-19 declaration under Section 564(b)(1) of the Act, 21 U.S.C. section 360bbb-3(b)(1), unless the authorization is terminated or revoked sooner.    Influenza A by PCR NEGATIVE NEGATIVE Final   Influenza B by PCR NEGATIVE NEGATIVE Final    Comment: (NOTE) The Xpert Xpress SARS-CoV-2/FLU/RSV assay is intended as an aid in  the diagnosis of influenza from Nasopharyngeal swab specimens and  should not be used as a sole basis for treatment. Nasal washings and  aspirates are unacceptable for Xpert Xpress SARS-CoV-2/FLU/RSV  testing. Fact Sheet for Patients: https://www.moore.com/https://www.fda.gov/media/142436/download Fact Sheet for Healthcare Providers: https://www.young.biz/https://www.fda.gov/media/142435/download This test is not yet approved or cleared by the [place] FDA and  has been authorized for detection and/or diagnosis of SARS-CoV-2 by  FDA under an Emergency Use Authorization (EUA). This EUA will remain  in effect (meaning this test can be used) for the duration of the  Covid-19 declaration under Section 564(b)(1) of the Act, 21  U.S.C. section 360bbb-3(b)(1), unless the authorization is  terminated or revoked. Performed at [facility], [address] [place], [place] [address]          Radiology Studies: DG Chest Port 1  View  Result Date: 12/29/2019 CLINICAL DATA:  Pain EXAM: PORTABLE CHEST 1 VIEW COMPARISON:  Chest radiograph September 16, 2019; chest CT September 24, 2019 FINDINGS: There is mild bibasilar atelectatic change. There is no edema or airspace opacity. Heart size and pulmonary vascularity are normal. Right-sided osteophytes in the thoracic spine again noted. There is postoperative change in the lower cervical region. IMPRESSION: Mild bibasilar atelectasis. Lungs otherwise clear. Stable cardiac silhouette. Electronically Signed   By: Bretta BangWilliam  Woodruff III M.D.   On: 01/05/2020 13:59   CT Angio Abd/Pel W  and/or Wo Contrast  Result Date: 13-Jan-2020 CLINICAL DATA:  Abdominal pain.  History of alcohol use. EXAM: CTA ABDOMEN AND PELVIS WITH CONTRAST TECHNIQUE: Multidetector CT imaging of the abdomen and pelvis was performed using the standard protocol during bolus administration of intravenous contrast. Arterial and venous phase images obtained. Multiplanar reconstructed images and MIPs were obtained and reviewed to evaluate the vascular anatomy. CONTRAST:  126mL OMNIPAQUE IOHEXOL 350 MG/ML SOLN COMPARISON:  None. FINDINGS: VASCULAR Aorta: No abdominal aortic aneurysm or dissection. Abdominal aorta widely patent. Celiac: Celiac artery and its branches are widely patent. No aneurysm or dissection involving these vessels. SMA: Superior mesenteric artery and its branches are widely patent. No aneurysm or dissection involving these vessels. Renals: There is a single renal artery on each side. Each renal artery and its branches are widely patent. No aneurysm or dissection. No evident fibromuscular dysplasia. IMA: Inferior mesenteric artery is somewhat diminutive but patent. No aneurysm or dissection involving this vessel or its branches. Inflow: There are occasional foci of calcification in each proximal hypogastric artery. No hemodynamically significant obstruction noted in pelvic arterial vessels. No aneurysm or dissection  involving these vessels. Proximal Outflow: Bilateral common femoral and visualized portions of the superficial and profunda femoral arteries are patent without evidence of aneurysm, dissection, vasculitis or significant stenosis. Veins: There is a focal thrombus in the proximal main portal vein, incompletely obstructing. No other venous defects evident. Review of the MIP images confirms the above findings. NON-VASCULAR Lower chest: Atelectatic change noted in each lower lung region. Lung bases otherwise clear. Hepatobiliary: There is diffuse hepatic steatosis. No focal liver lesions are appreciable. Gallbladder is borderline distended without wall thickening. There is no biliary duct dilatation. Pancreas: There is enhancement of the pancreatic head and uncinate process. There is loss of enhancement of the remainder of the pancreas consistent with extensive pancreatic necrosis. There is diffuse pancreatic edema consistent with acute pancreatitis. No well-defined mass or pseudocyst evident. No pancreatic duct dilatation. There is extensive fluid surrounding the pancreas with fluid tracking from the peripancreatic region on the left posteriorly to surround the posterior spleen. Fluid tracks anteriorly to partially surround the stomach. Fluid tracks to the right surrounding the distal stomach and proximal duodenum. Fluid tracks more laterally to abut the liver. Fluid tracks more inferiorly, surrounding the third portion of the duodenum and tracking into the right mid to lower abdomen. These are all changes indicative of acute pancreatitis. A small amount of fluid is noted in the dependent portion of the pelvis as well. Spleen: No splenic lesions are evident. Adrenals/Urinary Tract: Adrenals bilaterally appear unremarkable. Kidneys bilaterally show no evident mass or hydronephrosis. No renal or ureteral calculus. Urinary bladder wall thickness normal. Stomach/Bowel: There is wall thickening involving portions of the  distal stomach and most of the duodenum consistent with nearby pancreatitis. There are loops of wall thickening involving jejunum which in part may be due to the pancreatitis. A degree of underlying enteritis may also be present. No evident bowel obstruction. The terminal ileum appears normal. There is no evident free air or portal venous air. No intramural air noted. Lymphatic: No appreciable adenopathy evident in the abdomen or pelvis. Reproductive: Prostate and seminal vesicles are normal in size and contour. No evident pelvic mass. Other: Appendix appears normal. No abscess in the abdomen or pelvis. There is fat in each inguinal ring. Areas of ascites, primarily loculated, and largely if not entirely due to the extensive pancreatitis. Musculoskeletal: There is degenerative change in the lumbar spine. 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 There is anterior wedging of the L2 vertebral body. No blastic or lytic bone lesions. No abdominal wall or intramuscular lesions are evident. IMPRESSION: VASCULAR 1. No aneurysm or dissection involving the aorta, major mesenteric, or major pelvic arterial vessels. 2. Mild atherosclerotic plaque in each hypogastric artery. No other appreciable atherosclerosis. No fibromuscular dysplasia. NON-VASCULAR 1. Extensive acute pancreatitis with extensive peripancreatic fluid. Fluid tracks from the pancreas throughout the upper abdomen and to a lesser extent into the lower abdomen and pelvis,, primarily on the right side. 2. Extensive pancreatic necrosis with lack of enhancement of the body and tail the pancreas. Most of the head of the pancreas in the uncinate process of the pancreas show enhancement. There is diffuse pancreatic edema. No pancreatic mass or pancreatic duct dilatation. 3. 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 This finding is best appreciated on axial slice 37 series 11; coronal slice 83  series 14, and sagittal slices 91, 92, and 93 series 15. No other thrombus evident. 4. Wall thickening in multiple loops of small bowel, likely due to pancreatitis with potential degree of enteritis superimposed. No bowel obstruction. No evidence of bowel ischemia. No free air or portal venous air. 5.  Congenital narrowing throughout the lumbar region. 6.  Diffuse hepatic steatosis. Electronically Signed   By: Bretta Bang III M.D.   On: 12/15/2019 17:51        Scheduled Meds: . folic acid  1 mg Oral Daily  . multivitamin with minerals  1 tablet Oral Daily  . thiamine  100 mg Oral Daily   Or  . thiamine  100 mg Intravenous Daily   Continuous Infusions: . sodium chloride 150 mL/hr at 2020/02/02 0656  . heparin 1,300 Units/hr (02/02/2020 0656)  . piperacillin-tazobactam (ZOSYN)  IV       LOS: 1 day    Time spent: 40 minutes    Dorcas Carrow, MD [facility] Pager [phone]

## 2020-01-09 NOTE — Progress Notes (Signed)
ANTICOAGULATION CONSULT NOTE  Pharmacy Consult for Heparin Indication: portal vein thrombosis  Allergies  Allergen Reactions  . Amoxicillin Diarrhea    Patient Measurements: Height: 5\' 9"  (175.3 cm) Weight: 86.8 kg (191 lb 5.8 oz) IBW/kg (Calculated) : 70.7 Heparin Dosing Weight: 89.1 kg  Vital Signs: Temp: 99.7 F (37.6 C) (04/28 0332) Temp Source: Oral (04/28 0332) BP: 114/83 (04/28 0332) Pulse Rate: 158 (04/28 0332)  Labs: Recent Labs    12/29/2019 1102 12/28/2019 1315 12/18/2019 1533 01/24/20 0407  HGB 16.4  --   --  14.4  HCT 49.6  --   --  42.8  PLT 179  --   --  PLATELET CLUMPS NOTED ON SMEAR, COUNT APPEARS DECREASED  APTT  --   --  31  --   LABPROT  --   --  13.8  --   INR  --   --  1.1  --   HEPARINUNFRC  --   --   --  0.50  CREATININE  --  1.04  --  2.57*  CKTOTAL  --   --  196  --     Estimated Creatinine Clearance: 35 mL/min (A) (by C-G formula based on SCr of 2.57 mg/dL (H)).  Assessment: [name] with portal vein thrombus for heparin Goal of Therapy:  Heparin level 0.3-0.7 units/ml Monitor platelets by anticoagulation protocol: Yes   Plan:  Continue Heparin at current rate   57, PharmD, BCPS

## 2020-01-09 NOTE — Progress Notes (Signed)
Patient with Medicaid pending, has consult for Medication assistance. Patient now going to ICU Care Management to follow for [facility] program as needed

## 2020-01-09 NOTE — Progress Notes (Signed)
When the pt was admitted to floor his heart rate was in the 150s while his blood pressure was 97/78.  Informed the attending physician and they ordered a one time IV 1,000 mL normal saline bolus.  His heart rate started going back down to 140s during the bolus.  After bolus was given, the blood pressure went up to 101/76 but the heart rate was still mostly in the 150s.  The pt also developed a temperature of 100.3 F.  Pt became more agitated and diaphoretic. The physician was informed and gave new orders to draw blood cultures, start broad spectrum IV antibiotics, and PO Ibuprofen 400 mg every four hours as needed for fever.  Will continue to monitor.  Harriet Masson, RN

## 2020-01-09 NOTE — Progress Notes (Signed)
Time of death 2018. Sheilah Pigeon, RN and Pete Glatter, RN listened for absence of heart tones for one full minute.  None auscultated.

## 2020-01-09 NOTE — Procedures (Signed)
Cortrak  Person Inserting Tube:  Kendell Bane C, RD Tube Type:  Cortrak - 43 inches Tube Location:  Right nare Initial Placement:  Postpyloric Secured by: Bridle Technique Used to Measure Tube Placement:  Documented cm marking at nare/ corner of mouth Cortrak Secured At:  99 cm    Cortrak Tube Team Note:  Consult received to place a Cortrak feeding tube.  Pt with necrotizing pancreatitis with OG tube in place. Post pyloric (LOT) placement requested.   X-ray is required, abdominal x-ray has been ordered by the Cortrak team. Please confirm tube placement before using the Cortrak tube.   If the tube becomes dislodged please keep the tube and contact the Cortrak team at [email or web address] ([email or web address]) for replacement.  If after hours and replacement cannot be delayed, place a NG tube and confirm placement with an abdominal x-ray.    Cammy Copa., RD, LDN, CNSC See AMiON for contact information

## 2020-01-09 NOTE — Consult Note (Signed)
NAME:  Jack Fernandez, MRN:  [id number], DOB:  [birth year], LOS: 1 ADMISSION DATE:  01/07/2020, CONSULTATION DATE:  01-25-2020 REFERRING MD:  Dorcas Carrow, CHIEF COMPLAINT: Abdominal Pain, SOB   Brief History   54 yo M with Hx alcohol use, myelopathy, anxiety, depression, and homelessness (unclear if current) presented on 4/27 with abdominal pain. Found to have necrotizing pancreatitis on CT as well as portal vein thrombosis. Admitted to Hospitalist services treat with IVF, Pain control, Abx, Heparin, and NPO status. Despite 4L IVF o/n patent continued to have soft BP and this morning found to be profoundly acidotic with HCO3- <7, Tachypnic, and Tachycardic. PCCM consulted.  History of present illness   54 yo M with Hx alcohol use, myelopathy, anxiety, depression, and homelessness (unclear if current) presented on 4/27 with abdominal pain, n/v. Pain described as constant 8/10. Reported daily alcohol use of 2-3 cups of wine. Found to have necrotizing pancreatitis on CT as well as portal vein thrombosis. Admitted to Hospitalist services treat with IVF, Pain control, Abx, Heparin, and NPO status. Despite 4L IVF o/n patent continued to have soft BP and this morning found to be profoundly acidotic with HCO3- <7, Tachypnic, and Tachycardic. PCCM consulted.  Past Medical History  Alcohol Use HTN Myelopathy Neuropathy  Significant Hospital Events   4/27: Admit 4/28: Transfer to ICU, Intubated, HD Cath to be placed  Consults:  Nephrology  Procedures:  4/28: Intubation 4/28: HD cath placement  Significant Diagnostic Tests:  CTA Abd/Pelvis: IMPRESSION: VASCULAR 1. No aneurysm or dissection involving the aorta, major mesenteric, or major pelvic arterial vessels. 2. Mild atherosclerotic plaque in each hypogastric artery. No other appreciable atherosclerosis. No fibromuscular dysplasia.  NON-VASCULAR 1. Extensive acute pancreatitis with extensive peripancreatic fluid. Fluid tracks from the pancreas  throughout the upper abdomen and to a lesser extent into the lower abdomen and pelvis,  primarily on the right side. 2. Extensive pancreatic necrosis with lack of enhancement of the nbody and tail the pancreas. Most of the head of the pancreas in the uncinate process of the pancreas show enhancement. There is diffuse pancreatic edema. No pancreatic mass or pancreatic duct dilatation. 3. 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 This finding is best appreciated on axial slice 37 series 11; coronal slice 83 series 14, and sagittal slices 91, 92, and 93 series 15. No other thrombus evident. 4. Wall thickening in multiple loops of small bowel, likely due to pancreatitis with potential degree of enteritis superimposed. No bowel obstruction. No evidence of bowel ischemia. No free air or portal venous air. 5.  Congenital narrowing throughout the lumbar region. 6.  Diffuse hepatic steatosis.  Micro Data:  4/27 Blood Cultures: Pending  Antimicrobials:  Vanc 4/27 >> 4/28 Cefepime 4/27 >> 4/28 Flagyl 4/27 >> 4/28  Meropenem 4/28  Interim history/subjective:  Patient drowsy when initially seen prior to intubation. Significant distress, tachypnic, grimacing in pain, stating he is thirsty.  Objective   Blood pressure (!) 134/99, pulse (!) 142, temperature 98.3 F (36.8 C), temperature source Oral, resp. rate (!) 28, height 5\' 9"  (1.753 m), weight 86.8 kg, SpO2 100 %.    Vent Mode: PRVC FiO2 (%):  [100 %] 100 % Set Rate:  [28 bmp] 28 bmp Vt Set:  [560 mL-620 mL] 560 mL PEEP:  [5 cmH20] 5 cmH20 Plateau Pressure:  [28 cmH20] 28 cmH20   Intake/Output Summary (Last 24 hours) at Jan 25, 2020 1035 Last data filed at Jan 25, 2020 0656 Gross per 24 hour  Intake 4712.66 ml  Output --  Net 4712.66 ml   Filed Weights   Jan 26, 2020 1055 26-Jan-2020 2224  Weight: 90.7 kg 86.8 kg    Examination: General: Middle aged, male, in distress HENT: moist mucosa, anicteric Lungs:  Clear, Tachypnic Cardiovascular: Tachycardic, No r/m/g Abdomen: Distended, tender, BS+ Extremities: Cool, dry, no edema Neuro: Awakens to voice, conversational  Resolved Hospital Problem list    Assessment & Plan:   SIRS ?Enteritis Respiratory insufficiency Portal Vein Thrombosis Necrotizing Pancreatitis: In setting of alcohol use disorder. Decompensating this AM, BP remains borderline hypotensive despite 5L IVF since admission. Now tachycardic and tachypnic. LA rising when last checked. APACHE II score 22. Lipase 1500 >> 630 (may be burning out with necrosis). - Transfer to ICU - Intubated, Mechanical Ventilation - MAP goal >65, start Neo now - RASS goal 0 to -1; Precedex gtt, Fentanyl - VAP protocol - IVF, Bicarb gtt for now  - Change Antibiotics to Meropenem - Heparin for thrombosis - Follow up blood cultures - Repeat Lactic Acid - NPO  Hyponatremia Metabolic Acidosis Lactic Acidosis Acute Renal Failure: Worsening renal function Cr 1 >> 2.5. AGMA with HCO3- <7.  - Nephrology Consult - Place HD catheter, likely need for [name] for severe Acidosis, checking ABG - Replete electrolytes as needed - Trend Renal function and UOP  - Avoid nephrotoxic agents  Transaminitis Hepatic Steatosis Alcohol Use: Patient reports drinking 2-3 cups of wine per day for many years. Has never abstained for a day. ETOH 100 on admit. Noted to have steatosis on imaging. LFTs uptrending AST 1700, ALT 187, ALP 180, T.Bili 8. Suspect combination of alcoholic liver disease now complicated by inflammation from his pancreatitis and possible degree of shock liver.  - Precedex for sedation - CIWA - Follow LFTs - Maintain MAP as above - UDS pending  Best practice:  Diet: NPO Pain/Anxiety/Delirium protocol (if indicated): Precedex, PRN Fentanyl VAP protocol (if indicated): Yes DVT prophylaxis: Heparin GI prophylaxis: PPI Glucose control: None Mobility: BR Code Status: Full Family Communication:  Family updated Disposition: ICU  Labs   CBC: Recent Labs  Lab 01-26-2020 1102 12/14/2019 0407  WBC 12.0* 12.9*  HGB 16.4 14.4  HCT 49.6 42.8  MCV 87.5 84.9  PLT 179 PLATELET CLUMPS NOTED ON SMEAR, COUNT APPEARS DECREASED    Basic Metabolic Panel: Recent Labs  Lab 01/26/2020 1315 12/16/2019 0407 12/31/2019 0752  NA 132* 130*  --   K 4.6 5.0  --   CL 97* 104  --   CO2 19* <7*  --   GLUCOSE 75 186*  --   BUN 9 15  --   CREATININE 1.04 2.57*  --   CALCIUM 6.8* 4.8*  --   PHOS  --   --  3.8   GFR: Estimated Creatinine Clearance: 35 mL/min (A) (by C-G formula based on SCr of 2.57 mg/dL (H)). Recent Labs  Lab 01/26/2020 1102 01-26-20 1230 Jan 26, 2020 1322 01-26-20 1533 01-26-20 2001 01-26-20 2305 12/16/2019 0407  WBC 12.0*  --   --   --   --   --  12.9*  LATICACIDVEN  --    < > 6.0* 6.4* 4.5* 4.9*  --    < > = values in this interval not displayed.    Liver Function Tests: Recent Labs  Lab 01/26/20 1315 12/23/2019 0407  AST 1,201* 1,754*  ALT 179* 187*  ALKPHOS 171* 180*  BILITOT 4.4* 8.3*  PROT 7.1 5.6*  ALBUMIN 2.7* 2.1*   Recent Labs  Lab  Jan 29, 2020 1315 12/28/2019 0407  LIPASE 1,499* 637*   No results for input(s): AMMONIA in the last 168 hours.  ABG    Component Value Date/Time   TCO2 26 08/05/2018 1733     Coagulation Profile: Recent Labs  Lab 2020-01-29 1533  INR 1.1    Cardiac Enzymes: Recent Labs  Lab 2020-01-29 1533  CKTOTAL 196    HbA1C: No results found for: HGBA1C  CBG: Recent Labs  Lab 12/20/2019 0835  GLUCAP 242*    Review of Systems:   Negative except per HPI  Past Medical History  He,  has a past medical history of Alcoholic hepatitis without ascites, Alcoholism ([employer]), Ambulates with cane, Anxiety, Bipolar disorder ([employer]), Depression, Hypertension, Memory loss, Neuromuscular disorder ([place]), and Palpitations.   Surgical History    Past Surgical History:  Procedure Laterality Date  . ANTERIOR CERVICAL DECOMP/DISCECTOMY FUSION N/A  09/23/2019   Procedure: CERVICAL THREE-FOUR,CERVICAL FOUR-FIVE  , CERVICAL FIVE-SIX  ANTERIOR CERVICAL DECOMPRESSION/DISCECTOMY FUSION, ALLOGRAFT, PLATES, FUSION CERVICAL THREE-CERVICAL SIX;  Surgeon: Marybelle Killings, MD;  Location: [facility];  Service: Orthopedics;  Laterality: N/A;  . FINGER SURGERY     pinky - repair tendon - right hand     Social History   reports that he has never smoked. He has never used smokeless tobacco. He reports current alcohol use. He reports that he does not use drugs.   Family History   His family history includes HIV/AIDS in his father.   Allergies Allergies  Allergen Reactions  . Amoxicillin Diarrhea     Home Medications  Prior to Admission medications   Medication Sig Start Date End Date Taking? Authorizing Provider  amLODipine (NORVASC) 5 MG tablet Take 1 tablet (5 mg total) by mouth daily. 12/03/19  Yes Elsie Stain, MD  gabapentin (NEURONTIN) 300 MG capsule Take 2 capsules (600 mg total) by mouth 4 (four) times daily. 12/03/19 Jan 29, 2020 Yes Elsie Stain, MD  loratadine (CLARITIN) 10 MG tablet Take 1 tablet (10 mg total) by mouth daily. 12/03/19  Yes Elsie Stain, MD  PARoxetine (PAXIL) 20 MG tablet Take 1 tablet (20 mg total) by mouth daily. Patient not taking: Reported on 29-Jan-2020 12/15/19   Elsie Stain, MD     Critical care time:    Pearson Grippe, DO IM PGY-3

## 2020-01-09 NOTE — Significant Event (Signed)
Rapid Response Event Note  Overview: Tachypnea  Initial Focused Assessment: Called to bedside for patient having increase respiratory rate. Per nurse, patient is being treated for CIWA - CIWA was 10 per nurse. Upon arrival, patient's RR was in the mid 40s, + use of accessory muscles, patient was awake - could answer questions (oriented to self/place/situation but disoriented to time), + sclera yellow, abdomen - quite distended, lung sounds - clear in the upper fields but diminished in the lower fields. Skin - diaphoretic/clammy/very cold to touch in all extremities, despite getting 4L of NS boluses - veins flat - appears dry. + bowel sounds. + nausea/dry heaving at times. VS HR 145, BP 93/79(84), 100% on RA, RR 40-45, temp - 101.1 (rectal). Blood sugar > 200.   TRH MD came to bedside, PCCM team consulted and came to bedside.   Labs from 0400 - Na 130, CO2 < 7, CR 2.57, Ca 4.8, elevated LFTS, LA 4.9 (increased from 4.5).   Interventions: -- STAT Labs - multiple attempts made - unsuccessful on 4E -- STAT CXR -- Foley catheter placed. -- IAP - 23  -- Zofran  -- Sodium Bicarb infusion started -- Urgent transfer to 2H13 - once an ICU bed was made available - patient was transferred to 2H13 -   Plan of Care: -- Urgent transfer to ICU - upon arrival to [facility] -- intubated by PCCM team  Event Summary:  Call Time 0821 Arrival Time 0823 End Time 1000  Jack Fernandez R

## 2020-01-09 NOTE — Progress Notes (Signed)
Initial Nutrition Assessment  DOCUMENTATION CODES:   Not applicable  INTERVENTION:   Tube Feeding via Cortrak (LOT placement):  Initiate trickle TF of Vital AF 1.2 at 20 ml/hr Goal rate: Vital AF 1.2 at 70 ml/hr  Pro-Stat 30 mL TID  TF regimen at goal provides 2316 kcals, 171 g of protein and 1360 mL of free water   NUTRITION DIAGNOSIS:   Inadequate oral intake related to acute illness as evidenced by NPO status.  GOAL:   Patient will meet greater than or equal to 90% of their needs  MONITOR:   TF tolerance, Vent status, Labs, Weight trends  REASON FOR ASSESSMENT:   Ventilator    ASSESSMENT:   54 yo male admitted with severe necrotizing pancreatitis with multi-organ failure with AKI, acute liver injury and acute respiratory failure requiring intubation, [name]. PMH includes hepatic steatosis, EtOH use, HTN, depression   4/29 Intubated, [name] initiated, Cortrak (LOT)  Patient is currently intubated on ventilator support, sedated on fentanyl and precedex, requiring pressors MV: 17.3 L/min Temp (24hrs), Avg:99.6 F (37.6 C), Min:97.9 F (36.6 C), Max:101.1 F (38.4 C)  Propofol: none  OG to LIS  [name] with UF 0 ml/hr at present  Unable to obtain diet and weight history; current wt 86.8 kg. Possible weight loss per weight encounters.   Labs: sodium 132 (L), potassium 5.5 (H), Creatinine 3.74, BUN wdl, ionized calcium 0.77 (L), corrected calcium 6.3 (L), albumin 1.8 Meds: calcium gluconate, folic acid, liquid MVI, thiamine   Diet Order:   Diet Order            Diet NPO time specified  Diet effective now              EDUCATION NEEDS:   Not appropriate for education at this time  Skin:  Skin Assessment: Reviewed RN Assessment  Last BM:  4/26  Height:   Ht Readings from Last 1 Encounters:  01/03/2020 5\' 9"  (1.753 m)    Weight:   Wt Readings from Last 1 Encounters:  12/25/2019 86.8 kg    BMI:  Body mass index is 28.26 kg/m.  Estimated  Nutritional Needs:   Kcal:  2360 kcals  Protein:  145-190 g  Fluid:  >/= 2 L   2361 MS, RDN, LDN, CNSC RD Pager Number and RD On-Call Pager Number Located in [place]

## 2020-01-09 DEATH — deceased

## 2020-01-11 LAB — CULTURE, BLOOD (ROUTINE X 2)
Culture: NO GROWTH
Culture: NO GROWTH
Special Requests: ADEQUATE
Special Requests: ADEQUATE

## 2020-04-19 ENCOUNTER — Ambulatory Visit: Payer: Self-pay | Admitting: Critical Care Medicine

## 2020-10-23 IMAGING — RF DG CERVICAL SPINE 2 OR 3 VIEWS
1 series · 3 of 3 positions shown · non-contrast
Comparison: MR cervical spine 08/24/2019

FLUOROSCOPY TIME:  0 minutes 35 seconds

Images submitted: 3

CLINICAL DATA: C4 corpectomy with C3-C6 ACDF

EXAM:
CERVICAL SPINE - 2-3 VIEW

[Series 1: run · 3 of 3 slices shown]
[im 1/3]
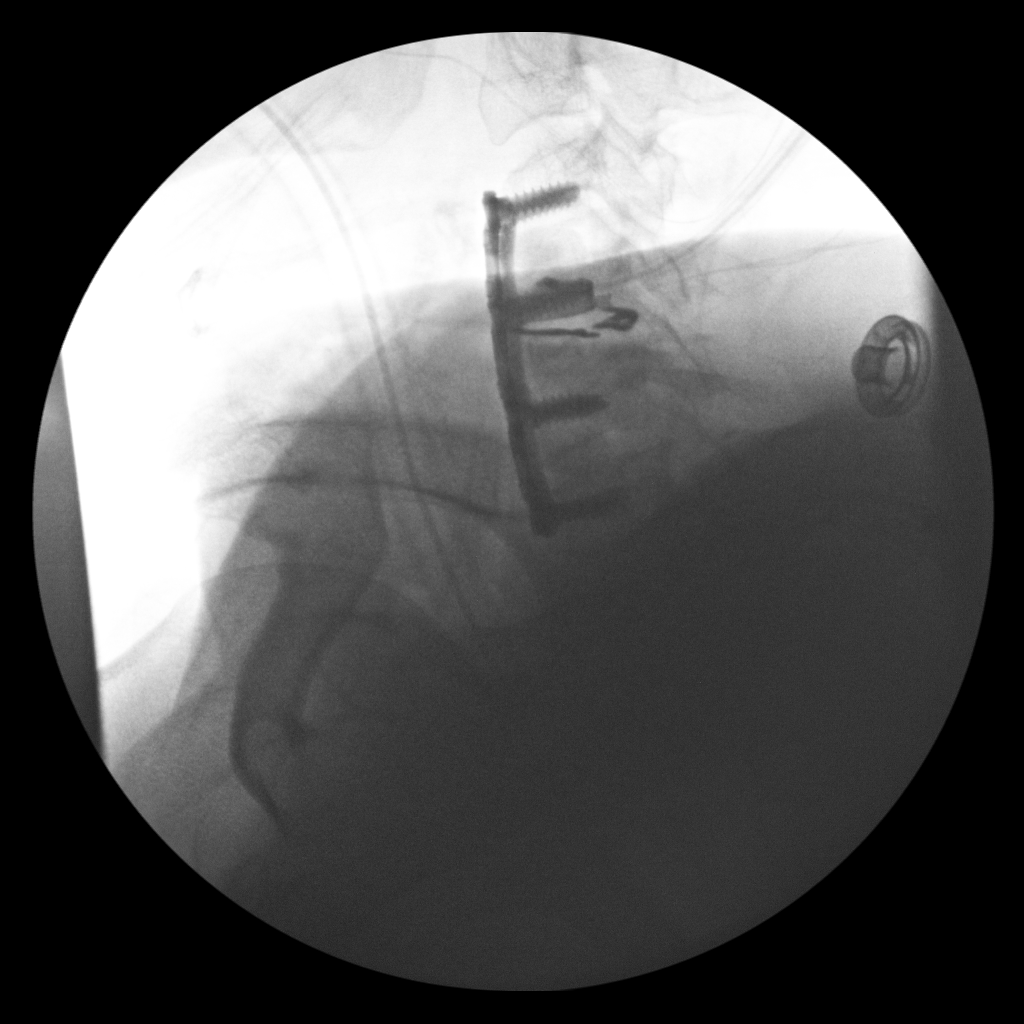
[im 2/3]
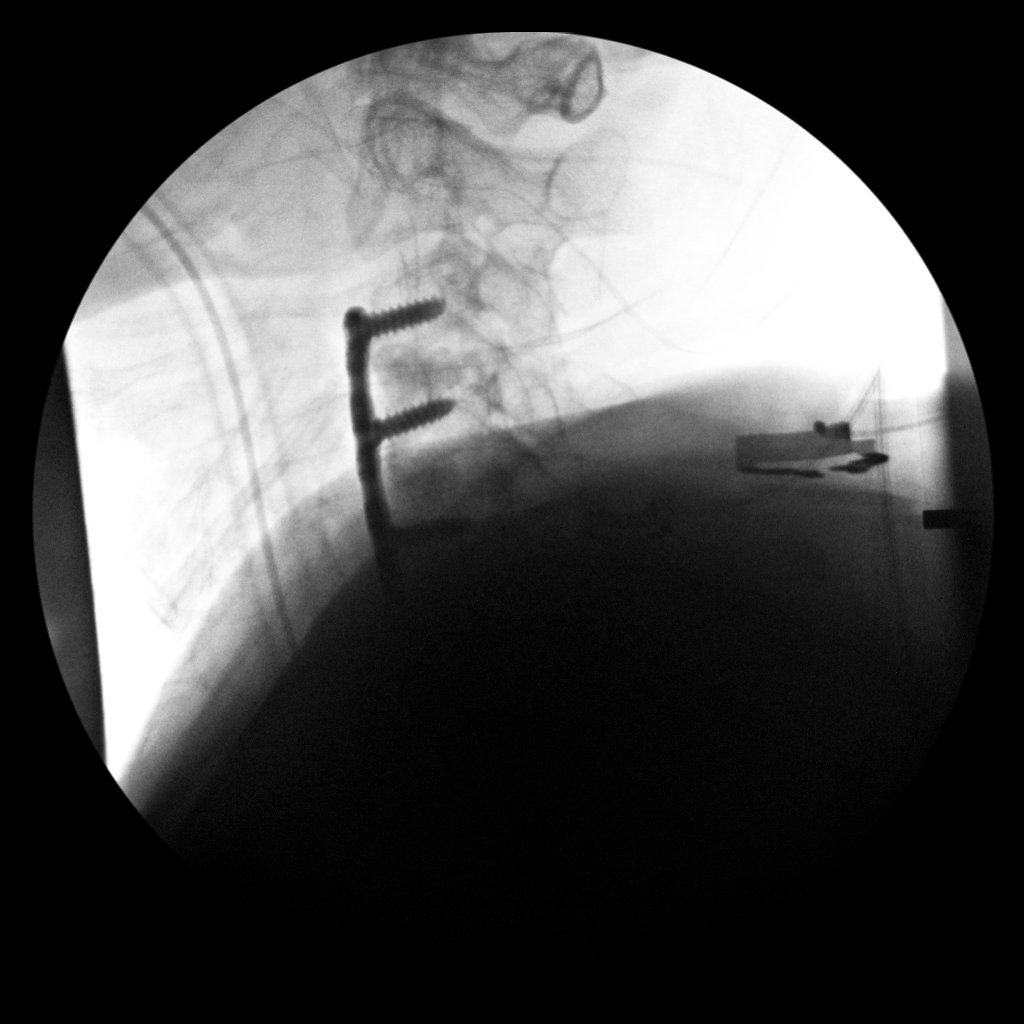
[im 3/3]
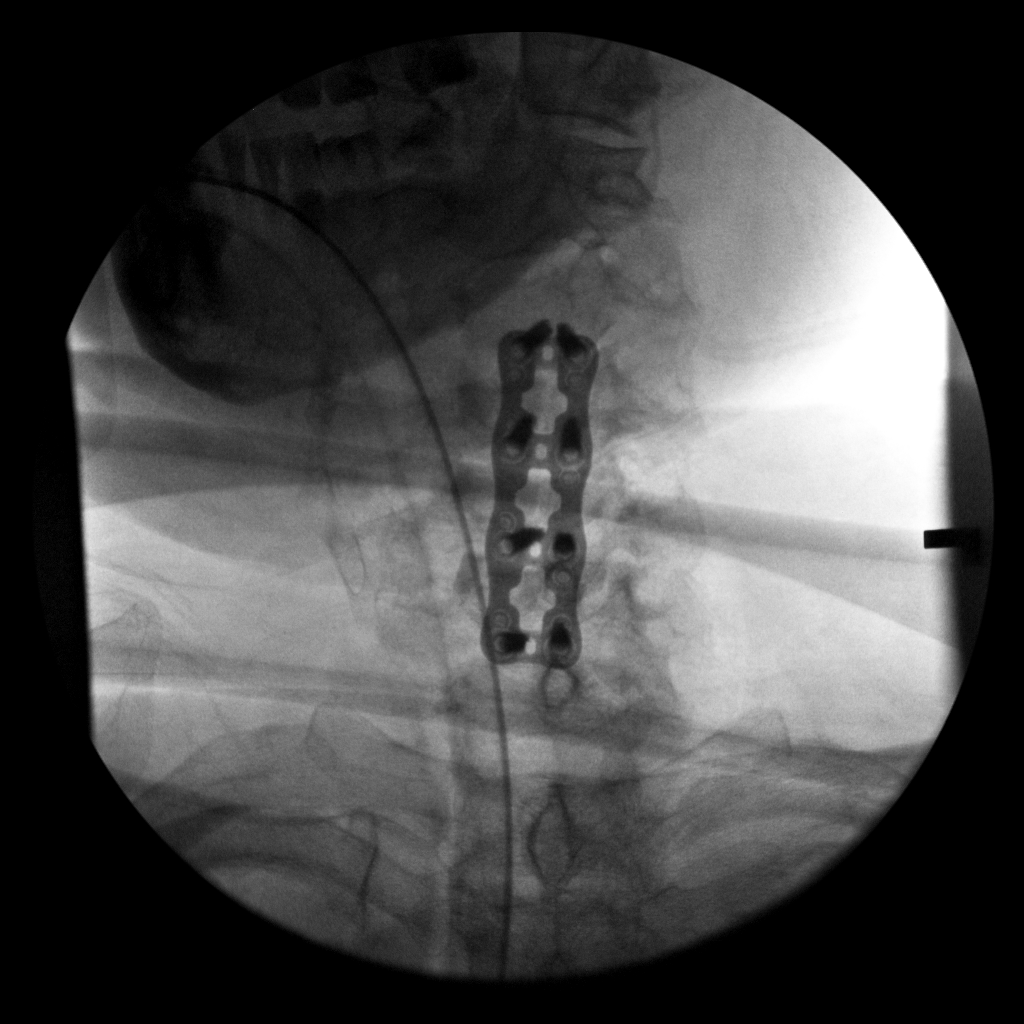

[3 of 3 positions shown; findings below may reference images not displayed]

FINDINGS: Osseous demineralization.

AP and lateral views of the cervical spine intraoperatively
demonstrate osseous demineralization.

Anterior plate and screws identified at C3-C6.

No fracture, subluxation, or bone destruction.
IMPRESSION: Anterior fusion C3-C6.
